# Patient Record
Sex: Female | Born: 1975
Health system: Southern US, Community
[De-identification: ages and names within clinical notes are randomized; demographics above are authoritative.]

## PROBLEM LIST (undated history)

## (undated) DIAGNOSIS — N12 Tubulo-interstitial nephritis, not specified as acute or chronic: Secondary | ICD-10-CM

## (undated) DIAGNOSIS — I10 Essential (primary) hypertension: Secondary | ICD-10-CM

## (undated) DIAGNOSIS — N189 Chronic kidney disease, unspecified: Secondary | ICD-10-CM

## (undated) HISTORY — PX: TUBAL LIGATION: SHX77

---

## 1997-12-03 ENCOUNTER — Emergency Department (HOSPITAL_COMMUNITY): Admission: EM | Admit: 1997-12-03 | Discharge: 1997-12-04 | Payer: Self-pay

## 1998-11-24 ENCOUNTER — Other Ambulatory Visit: Admission: RE | Admit: 1998-11-24 | Discharge: 1998-11-24 | Payer: Self-pay | Admitting: *Deleted

## 1999-02-09 ENCOUNTER — Other Ambulatory Visit: Admission: RE | Admit: 1999-02-09 | Discharge: 1999-02-09 | Payer: Self-pay | Admitting: *Deleted

## 1999-03-04 ENCOUNTER — Other Ambulatory Visit: Admission: RE | Admit: 1999-03-04 | Discharge: 1999-03-04 | Payer: Self-pay | Admitting: *Deleted

## 1999-03-04 ENCOUNTER — Encounter (INDEPENDENT_AMBULATORY_CARE_PROVIDER_SITE_OTHER): Payer: Self-pay

## 1999-07-26 ENCOUNTER — Other Ambulatory Visit: Admission: RE | Admit: 1999-07-26 | Discharge: 1999-07-26 | Payer: Self-pay | Admitting: *Deleted

## 2000-01-31 ENCOUNTER — Other Ambulatory Visit: Admission: RE | Admit: 2000-01-31 | Discharge: 2000-01-31 | Payer: Self-pay | Admitting: *Deleted

## 2000-02-14 ENCOUNTER — Other Ambulatory Visit: Admission: RE | Admit: 2000-02-14 | Discharge: 2000-02-14 | Payer: Self-pay | Admitting: *Deleted

## 2000-08-01 ENCOUNTER — Other Ambulatory Visit: Admission: RE | Admit: 2000-08-01 | Discharge: 2000-08-01 | Payer: Self-pay | Admitting: Obstetrics and Gynecology

## 2000-12-06 ENCOUNTER — Inpatient Hospital Stay (HOSPITAL_COMMUNITY): Admission: AD | Admit: 2000-12-06 | Discharge: 2000-12-23 | Payer: Self-pay | Admitting: Obstetrics and Gynecology

## 2000-12-06 ENCOUNTER — Encounter (INDEPENDENT_AMBULATORY_CARE_PROVIDER_SITE_OTHER): Payer: Self-pay

## 2000-12-07 ENCOUNTER — Encounter: Payer: Self-pay | Admitting: Obstetrics and Gynecology

## 2000-12-09 ENCOUNTER — Encounter: Payer: Self-pay | Admitting: Obstetrics and Gynecology

## 2000-12-11 ENCOUNTER — Encounter: Payer: Self-pay | Admitting: Obstetrics and Gynecology

## 2000-12-14 ENCOUNTER — Encounter: Payer: Self-pay | Admitting: Obstetrics and Gynecology

## 2000-12-19 ENCOUNTER — Encounter: Payer: Self-pay | Admitting: Obstetrics and Gynecology

## 2000-12-20 ENCOUNTER — Encounter: Payer: Self-pay | Admitting: Obstetrics and Gynecology

## 2000-12-24 ENCOUNTER — Encounter: Admission: RE | Admit: 2000-12-24 | Discharge: 2001-01-23 | Payer: Self-pay | Admitting: Obstetrics and Gynecology

## 2001-01-24 ENCOUNTER — Encounter: Admission: RE | Admit: 2001-01-24 | Discharge: 2001-02-23 | Payer: Self-pay | Admitting: Obstetrics and Gynecology

## 2001-03-26 ENCOUNTER — Encounter: Admission: RE | Admit: 2001-03-26 | Discharge: 2001-04-25 | Payer: Self-pay | Admitting: Obstetrics and Gynecology

## 2001-05-26 ENCOUNTER — Encounter: Admission: RE | Admit: 2001-05-26 | Discharge: 2001-06-25 | Payer: Self-pay | Admitting: Obstetrics and Gynecology

## 2001-06-26 ENCOUNTER — Encounter: Admission: RE | Admit: 2001-06-26 | Discharge: 2001-07-26 | Payer: Self-pay | Admitting: Obstetrics and Gynecology

## 2001-10-29 ENCOUNTER — Ambulatory Visit (HOSPITAL_COMMUNITY): Admission: RE | Admit: 2001-10-29 | Discharge: 2001-10-29 | Payer: Self-pay | Admitting: Urology

## 2001-10-29 ENCOUNTER — Encounter: Payer: Self-pay | Admitting: Urology

## 2001-11-26 ENCOUNTER — Encounter: Payer: Self-pay | Admitting: Urology

## 2001-11-26 ENCOUNTER — Ambulatory Visit (HOSPITAL_COMMUNITY): Admission: RE | Admit: 2001-11-26 | Discharge: 2001-11-26 | Payer: Self-pay | Admitting: Urology

## 2002-01-28 ENCOUNTER — Other Ambulatory Visit: Admission: RE | Admit: 2002-01-28 | Discharge: 2002-01-28 | Payer: Self-pay | Admitting: *Deleted

## 2004-01-30 ENCOUNTER — Emergency Department (HOSPITAL_COMMUNITY): Admission: EM | Admit: 2004-01-30 | Discharge: 2004-01-30 | Payer: Self-pay | Admitting: Emergency Medicine

## 2005-03-31 ENCOUNTER — Other Ambulatory Visit: Admission: RE | Admit: 2005-03-31 | Discharge: 2005-03-31 | Payer: Self-pay | Admitting: *Deleted

## 2005-08-04 ENCOUNTER — Other Ambulatory Visit: Admission: RE | Admit: 2005-08-04 | Discharge: 2005-08-04 | Payer: Self-pay | Admitting: *Deleted

## 2005-12-15 ENCOUNTER — Other Ambulatory Visit: Admission: RE | Admit: 2005-12-15 | Discharge: 2005-12-15 | Payer: Self-pay | Admitting: *Deleted

## 2006-04-08 ENCOUNTER — Emergency Department (HOSPITAL_COMMUNITY): Admission: EM | Admit: 2006-04-08 | Discharge: 2006-04-08 | Payer: Self-pay | Admitting: Family Medicine

## 2006-09-11 ENCOUNTER — Other Ambulatory Visit: Admission: RE | Admit: 2006-09-11 | Discharge: 2006-09-11 | Payer: Self-pay | Admitting: *Deleted

## 2007-12-03 ENCOUNTER — Other Ambulatory Visit: Admission: RE | Admit: 2007-12-03 | Discharge: 2007-12-03 | Payer: Self-pay | Admitting: Gynecology

## 2009-07-29 ENCOUNTER — Emergency Department (HOSPITAL_COMMUNITY): Admission: EM | Admit: 2009-07-29 | Discharge: 2009-07-29 | Payer: Self-pay | Admitting: Family Medicine

## 2010-03-19 ENCOUNTER — Ambulatory Visit (HOSPITAL_BASED_OUTPATIENT_CLINIC_OR_DEPARTMENT_OTHER): Payer: 59 | Admitting: Oncology

## 2010-08-18 ENCOUNTER — Encounter: Payer: 59 | Admitting: Oncology

## 2010-08-18 DIAGNOSIS — D509 Iron deficiency anemia, unspecified: Secondary | ICD-10-CM

## 2010-08-18 LAB — CBC & DIFF AND RETIC
BASO%: 0.3 % (ref 0.0–2.0)
Basophils Absolute: 0 10*3/uL (ref 0.0–0.1)
EOS%: 5.4 % (ref 0.0–7.0)
Eosinophils Absolute: 0.3 10*3/uL (ref 0.0–0.5)
HCT: 34.7 % — ABNORMAL LOW (ref 34.8–46.6)
HGB: 10.7 g/dL — ABNORMAL LOW (ref 11.6–15.9)
Immature Retic Fract: 5.4 % (ref 0.00–10.70)
LYMPH%: 31.5 % (ref 14.0–49.7)
MCH: 23.3 pg — ABNORMAL LOW (ref 25.1–34.0)
MCHC: 30.8 g/dL — ABNORMAL LOW (ref 31.5–36.0)
MCV: 75.4 fL — ABNORMAL LOW (ref 79.5–101.0)
MONO#: 0.3 10*3/uL (ref 0.1–0.9)
MONO%: 5.1 % (ref 0.0–14.0)
NEUT#: 3.5 10*3/uL (ref 1.5–6.5)
NEUT%: 57.7 % (ref 38.4–76.8)
Platelets: 395 10*3/uL (ref 145–400)
RBC: 4.6 10*6/uL (ref 3.70–5.45)
RDW: 16.6 % — ABNORMAL HIGH (ref 11.2–14.5)
Retic %: 0.59 % (ref 0.50–1.50)
Retic Ct Abs: 27.14 10*3/uL (ref 18.30–72.70)
WBC: 6.1 10*3/uL (ref 3.9–10.3)
lymph#: 1.9 10*3/uL (ref 0.9–3.3)

## 2010-08-18 LAB — COMPREHENSIVE METABOLIC PANEL
ALT: 12 U/L (ref 0–35)
Albumin: 3.8 g/dL (ref 3.5–5.2)
CO2: 29 mEq/L (ref 19–32)
Calcium: 9.4 mg/dL (ref 8.4–10.5)
Chloride: 98 mEq/L (ref 96–112)
Glucose, Bld: 154 mg/dL — ABNORMAL HIGH (ref 70–99)
Sodium: 137 mEq/L (ref 135–145)
Total Bilirubin: 0.5 mg/dL (ref 0.3–1.2)
Total Protein: 8.3 g/dL (ref 6.0–8.3)

## 2010-08-18 LAB — FERRITIN: Ferritin: 63 ng/mL (ref 10–291)

## 2010-08-18 LAB — CHCC SMEAR

## 2010-08-18 LAB — MORPHOLOGY

## 2010-08-18 LAB — IRON AND TIBC
%SAT: 7 % — ABNORMAL LOW (ref 20–55)
Iron: 19 ug/dL — ABNORMAL LOW (ref 42–145)

## 2010-08-18 LAB — LACTATE DEHYDROGENASE: LDH: 109 U/L (ref 94–250)

## 2010-08-24 ENCOUNTER — Encounter (HOSPITAL_BASED_OUTPATIENT_CLINIC_OR_DEPARTMENT_OTHER): Payer: 59 | Admitting: Oncology

## 2010-08-24 DIAGNOSIS — D509 Iron deficiency anemia, unspecified: Secondary | ICD-10-CM

## 2010-10-01 NOTE — Discharge Summary (Signed)
[facility]  Patient:    Sandra Carpenter, Sandra Carpenter                        MRN: [id number] Adm. Date:  [id number] Disc. Date: 12/23/00 Attending:  Leonard Schwartz Dictator:   Mack Guise, C.N.M.                           Discharge Summary  ADMISSION DIAGNOSES:          1. Intrauterine pregnancy at 29-2/7 weeks.                               2. Preeclampsia.                               3. Pulmonary edema.                               4. Urinary tract infection.                               5. Anemia.                               6. Positive JKa antibody.                               7. Positive group B strep.                               8. Nonreassuring fetal heart rate tracing.  PROCEDURES:                   Primary low transverse cesarean delivery at 31-1/7 weeks.  DISCHARGE DIAGNOSES:          1. Intrauterine pregnancy at 29-2/7 weeks.                               2. Preeclampsia.                               3. Pulmonary edema.                               4. Urinary tract infection.                               5. Anemia.                               6. Positive JKa antibody.                               7. Positive group B strep.  8. Nonreassuring fetal heart rate tracing.                               9. Primary low transverse cesarean delivery at                                  31-1/7 weeks.  HOSPITAL COURSE:              Ms. Sandra Carpenter is a 35 year old, gravida 3, para 1-0-1-1, who was admitted at 29-2/7 weeks for PIH and preeclampsia.  She received tocolysis with magnesium sulfate and betamethasone series for fetal lung maturity.  Her UTI was treated with IV antibiotics.  Her PIH laboratories remained stable, and her blood pressure remained under control.  She developed pulmonary edema, which was treated with diuretics.  On the day of December 20, 2000, the patients blood pressure became more labile,  and fetal heart rate tracing became nonreassuring.  Ultrasound found abnormal Doppler studies, and the decision was made to undergo cesarean delivery at that time.  The patient underwent primary low transverse cesarean section on December 20, 2000, by Dr. Marline Backbone, with the birth of a 3-pound 5-ounce female infant named Madelin Rear, with Apgars of 4 at one minute, 9 at five minutes.  The patient has been stable in the NICU, and mother has been stable in the immediate postoperative period.  She continued on magnesium sulfate for over 24 hours. Her postoperative laboratory work is all within normal limits.  Hemoglobin on the first postoperative day was 8.2, WBC 9.9, platelets 179,000.  Potassium 5.1, BUN 19, creatinine 1.5, SGOT 27, SGPT 8.  Her blood pressure has been stable in the range of 140-160/90-100.  She is currently taking labetalol 100 mg p.o. b.i.d.  Her urine output has been adequate, and she has been deemed to be in satisfactory condition for discharge.  DISCHARGE INSTRUCTIONS:       Per [facility] handout.  DISCHARGE MEDICATIONS:        1. Motrin 600 mg p.o. q.6h. p.r.n. pain.                               2. Tylox 1-2 p.o. q.3-4h. pain.                               3. Labetalol 100 mg p.o. b.i.d.  FOLLOW-UP:                    The patient will call the office of [facility] to arrange an appointment for blood pressure check this week.  She will also be followed at six weeks postoperatively. DD:  12/23/00 TD:  12/24/00 Job: [id number]

## 2010-10-01 NOTE — H&P (Signed)
[facility]  Patient:    Sandra Carpenter, Sandra Carpenter                          MRN: [id number] Adm. Date:  12/06/00 Attending:  Janine Limbo, M.D. Dictator:   Wynelle Bourgeois, CNM                         History and Physical  BRIEF HISTORY:  Ms. Sandra Carpenter is a 35 year old G3, para 1, 0-1-1 at 10 and 2/7th weeks who presents from the office with hypertension, hyperreflexia, proteinuria and edema.  She denies headache or scotoma.  Her pregnancy has been followed by the M.D. service and is remarkable for a history of:  1. Abnormal Pap smear with [facility] biopsy. 2. History of asthma. 3. Group B streptococcus positive. 4. Anti JKA antibody. 5. Equivocal rubella. 6. Anemia.  OB HISTORY: 1. Remarkable for an EAB in 1991, at 11 to [redacted] weeks gestation. 2. Vaginal delivery in August of 1997 of a female infant at [redacted] weeks gestation    weighing 6 pounds 7 ounces complicated by a nuchal cord.  MEDICAL HISTORY:  Remarkable for a history of anemia.  History of abnormal Pap smear with a [facility] biopsy in 2001, childhood varicella.  History of hyperemesis in her first pregnancy.  History of asthma.  Frequent UTIs.  A history of pyelonephritis.  FAMILY HISTORY:  Remarkable for maternal grandmother and uncle with diabetes. Maternal uncle with seizures, paternal uncle with lupus, maternal aunt x 2 with breast cancer.  GENETIC HISTORY:  Unremarkable.  SOCIAL HISTORY:  Patient is single but involved with Graciella Freer who is involved and supportive.  She is of the [place] faith.  She denies any alcohol, tobacco or drug use.  PHYSICAL EXAMINATION:  VITAL SIGNS:  Blood pressure 98.7, blood pressure 171/104.  HEENT:  Within normal limits except for 1 plus edema in the face.  NECK:  Thyroid normal.  Not enlarged.  BREASTS:  Soft, nontender, no masses.  HEART:  Rate is regular rate and rhythm.  LUNGS:  Clear to auscultation bilaterally.  ABDOMEN:  Gravid at 29 cm.  EFM shows a  reactive fetal heart rate with positive accelerations and no uterine contractions.  CERVICAL EXAMINATION:  Closed 40% effaced, soft, minus 4 station, and questionable presenting part.  EXTREMITIES:  1 to 2+ edema throughout.  DTRs are 3+ with 2 beats of clonus.  LABORATORY DATA:  Her labs today show a catheterized urine sample of 100 mg per deciliter of protein.  CBC is remarkable for a hemoglobin of 8.6 and a hematocrit of 26.2, and a platelet count of 272,000.  Uric acid is 6.5.  LDH is 155.  Alk. phos. is 228.  SGOT 20, SGPT 14, other chemistries within normal limits.  ASSESSMENT: 1. Intrauterine pregnancy at 66 and 2/7 weeks. 2. Preeclampsia. 3. Anemia with anti JKA antibody. 4. Group B streptococcus positive.  PLAN: 1. Admit per Dr. Krista Blue. 2. Magnesium sulfate 4 grams IV now then 2 grams per hour. 3. Betamethasone 12.5 mg IM now and repeat in 24 hours. 4. A 24 hour urine for protein and creatinine clearance. 5. Further orders per Dr. Stefano Gaul.DD:  12/06/00 TD:  12/06/00 Job: [id number]

## 2010-10-01 NOTE — Op Note (Signed)
[facility]  Patient:    Sandra Carpenter, Sandra Carpenter                          MRN: [id number] Proc. Date: 12/20/00 Attending:  Janine Limbo, M.D.                           Operative Report  PREOPERATIVE DIAGNOSES:       1. A 37-1/[redacted] week gestation.                               2. Preeclampsia.                               3. Nonreassuring fetal heart rate tracing.  POSTOPERATIVE DIAGNOSES:      1. A 37-1/[redacted] week gestation.                               2. Preeclampsia.                               3. Nonreassuring fetal heart rate tracing.  PROCEDURE:                    Primary low transverse cesarean section.  SURGEON:                      Janine Limbo, M.D.  FIRST ASSISTANT:              Mack Guise, C.N.M.  ANESTHESIA:                   Spinal.  INDICATIONS:                  The patient is a 35 year old female, gravida 3, para 1-0-1-1 without was admitted on December 06, 2000 because of preeclampsia. The patient was started on magnesium.  She was given betamethasone.  She was observed in the hospital.  Her blood pressures began to rise sharply on December 19, 2000.  Her blood pressures today were as high as 167/118 in spite of p.o. and IV Apresoline as well as p.o. and IV Labetalol.  In addition, the patient began having late decelerations on the fetal monitor.  An ultrasound was performed and it showed an abnormal uterine artery as well as middle cerebral artery Doppler study.  I discussed the options for management with the patient, and the decision was made to proceed with cesarean delivery.  We reviewed the risks associated with cesarean section including but not limited to anesthetic complications, bleeding, infection and possible damage to the surrounding organs.  FINDINGS:                     A 3 lb 5 oz female infant Madelin Rear) was delivered. Apgars were 4 at one minute and 9 at five minutes.  The uterus, fallopian tubes and ovaries were  normal for the gravid state.  DESCRIPTION OF PROCEDURE:     The patient was taken to the operating room, where a spinal anesthetic was given.  The patients abdomen and perineum were prepped with multiple layers of Betadine.  A Foley catheter was placed  in the bladder.  The patient was sterilely draped.  A low transverse incision was made in the abdomen and carried sharply through the subcutaneous tissue, the fascia and the anterior peritoneum.  An incision was made in the lower uterine segment and extended transversely.  We were going to deliver the infant from a vertex presentation.  However, the vertex floated to the patients right and then in the right upper quadrant.  We therefore grabbed the feet of the infant and delivered the infant by double footling breech extraction.  Apgars were 4 at one minute and 9 at five minutes.  The cord was clamped and cut.  The infant was handed to the awaiting pediatric team.  Routine cord blood studies were obtained.  The placenta was removed.  The uterine cavity was cleaned of amniotic fluid, clotted blood and membranes.  The uterine incision was closed using a running locking suture of 2-0 Vicryl.  Hemostasis was adequate.  The pelvic cavity was vigorously irrigated.  All instruments were removed. Hemostasis was noted to be adequate.  The anterior peritoneum and the abdominal musculature were reapproximated in the midline using 2-0 Vicryl. The fascia and the subcutaneous layer were irrigated.  The fascia was closed using a running suture of 0 Vicryl followed by three interrupted sutures of 0 Vicryl.  The subcutaneous layer was closed using a running suture of 2-0 Vicryl.  The skin was reapproximated using skin staples. Sponge, needle and instrument counts were correct on two occasions.  Estimated blood loss was 500 cc.  The patient tolerated the procedure well.  The patient was taken to the recovery room in stable condition.  The infant was taken to  the intensive-care nursery for observation. DD:  12/20/00 TD:  12/21/00 Job: [id number]

## 2010-10-01 NOTE — Op Note (Signed)
NAMEMarland Carpenter  DARALYN, BERT NO.:  [id number]   MEDICAL RECORD NO.:  [id number]                   PATIENT TYPE:  EMS   LOCATION:  ED                                   [facility]ACILITY[facility]  [facility]   PHYSICIAN:  Abigail Miyamoto, M.D.              DATE OF BIRTH:  1976-05-12   DATE OF PROCEDURE:  01/30/2004  DATE OF DISCHARGE:                                 OPERATIVE REPORT   PREOPERATIVE DIAGNOSIS:  Suprapubic groin abscess.   POSTOPERATIVE DIAGNOSIS:  Suprapubic groin abscess.   PROCEDURE:  Incision and drainage of right groin abscess.   SURGEON:  Dr. Riley Lam A. Blackman   ANESTHESIA:  1% lidocaine.   PROCEDURE IN DETAIL:  The patient was on the stretcher in the emergency  department.  Her right groin was then prepped and draped the usual sterile  fashion.  The skin overlying the abscess was then anesthetized with 1%  lidocaine.  A small incision was then make with an 11 blade, and the abscess  cavity was entered, and a moderate amount of purulence was drained.  The  wound was the probed with a hemostat, and no further purulent pockets were  identified.  The wound was then packed with a dry gauze.  More gauze was  then placed over this and this taped in place.  The patient tolerated the  procedure well.  The plan at this point will be to discharge her home on  antibiotics with wound care instructions given and follow up in my office in  4 days.      DB/MEDQ  D:  01/30/2004  T:  02/01/2004  Job:  [id number]

## 2012-10-25 ENCOUNTER — Encounter (HOSPITAL_COMMUNITY): Payer: Self-pay | Admitting: Emergency Medicine

## 2012-10-25 ENCOUNTER — Emergency Department (HOSPITAL_COMMUNITY)
Admission: EM | Admit: 2012-10-25 | Discharge: 2012-10-25 | Disposition: A | Discharge status: Home / Self Care | Payer: 59 | Attending: Emergency Medicine | Admitting: Emergency Medicine

## 2012-10-25 DIAGNOSIS — T2111XA Burn of first degree of chest wall, initial encounter: Secondary | ICD-10-CM

## 2012-10-25 DIAGNOSIS — T2210XA Burn of first degree of shoulder and upper limb, except wrist and hand, unspecified site, initial encounter: Secondary | ICD-10-CM

## 2012-10-25 DIAGNOSIS — Y9289 Other specified places as the place of occurrence of the external cause: Secondary | ICD-10-CM | POA: Insufficient documentation

## 2012-10-25 DIAGNOSIS — Z880 Allergy status to penicillin: Secondary | ICD-10-CM | POA: Insufficient documentation

## 2012-10-25 DIAGNOSIS — T2600XA Burn of unspecified eyelid and periocular area, initial encounter: Secondary | ICD-10-CM

## 2012-10-25 DIAGNOSIS — Z79899 Other long term (current) drug therapy: Secondary | ICD-10-CM | POA: Insufficient documentation

## 2012-10-25 DIAGNOSIS — Z23 Encounter for immunization: Secondary | ICD-10-CM | POA: Insufficient documentation

## 2012-10-25 DIAGNOSIS — X028XXA Other exposure to controlled fire in building or structure, initial encounter: Secondary | ICD-10-CM | POA: Insufficient documentation

## 2012-10-25 DIAGNOSIS — I10 Essential (primary) hypertension: Secondary | ICD-10-CM | POA: Insufficient documentation

## 2012-10-25 DIAGNOSIS — Y9389 Activity, other specified: Secondary | ICD-10-CM | POA: Insufficient documentation

## 2012-10-25 HISTORY — DX: Essential (primary) hypertension: I10

## 2012-10-25 LAB — COMPREHENSIVE METABOLIC PANEL
AST: 20 U/L (ref 0–37)
CO2: 28 mEq/L (ref 19–32)
Calcium: 9.7 mg/dL (ref 8.4–10.5)
Creatinine, Ser: 1.06 mg/dL (ref 0.50–1.10)
GFR calc non Af Amer: 65 mL/min — ABNORMAL LOW (ref >90)

## 2012-10-25 LAB — CBC WITH DIFFERENTIAL/PLATELET
Basophils Absolute: 0 10*3/uL (ref 0.0–0.1)
Basophils Relative: 0 % (ref 0–1)
Eosinophils Absolute: 0.3 10*3/uL (ref 0.0–0.7)
Eosinophils Relative: 3 % (ref 0–5)
HCT: 33.5 % — ABNORMAL LOW (ref 36.0–46.0)
MCH: 23.8 pg — ABNORMAL LOW (ref 26.0–34.0)
MCHC: 31.9 g/dL (ref 30.0–36.0)
MCV: 74.6 fL — ABNORMAL LOW (ref 78.0–100.0)
Monocytes Absolute: 0.6 10*3/uL (ref 0.1–1.0)
RDW: 16.7 % — ABNORMAL HIGH (ref 11.5–15.5)

## 2012-10-25 MED ORDER — TETANUS-DIPHTH-ACELL PERTUSSIS 5-2.5-18.5 LF-MCG/0.5 IM SUSP
0.5000 mL | Freq: Once | INTRAMUSCULAR | Status: AC
  Administered 2012-10-25: 0.5 mL via INTRAMUSCULAR
  Filled 2012-10-25 (×2): qty 0.5

## 2012-10-25 MED ORDER — BACITRACIN-POLYMYXIN B 500-10000 UNIT/GM OP OINT
TOPICAL_OINTMENT | Freq: Four times a day (QID) | OPHTHALMIC | Status: DC
  Administered 2012-10-25: 23:00:00 via OPHTHALMIC
  Filled 2012-10-25: qty 3.5

## 2012-10-25 MED ORDER — HYDROCODONE-ACETAMINOPHEN 5-325 MG PO TABS: 1.0000 | ORAL_TABLET | ORAL | Status: DC | PRN

## 2012-10-25 MED ORDER — FENTANYL CITRATE 0.05 MG/ML IJ SOLN
50.0000 ug | Freq: Once | INTRAMUSCULAR | Status: AC
  Administered 2012-10-25: 50 ug via INTRAVENOUS
  Filled 2012-10-25: qty 2

## 2012-10-25 MED ORDER — BACITRACIN ZINC 500 UNIT/GM EX OINT
TOPICAL_OINTMENT | Freq: Two times a day (BID) | CUTANEOUS | Status: DC
  Administered 2012-10-25: 23:00:00 via TOPICAL
  Filled 2012-10-25: qty 15

## 2012-10-25 MED ORDER — BACITRACIN-POLYMYXIN B 500-10000 UNIT/GM OP OINT: TOPICAL_OINTMENT | Freq: Four times a day (QID) | OPHTHALMIC | Status: DC

## 2012-10-25 MED ORDER — BACITRACIN ZINC 500 UNIT/GM EX OINT: TOPICAL_OINTMENT | Freq: Two times a day (BID) | CUTANEOUS | Status: DC

## 2012-10-25 NOTE — ED Notes (Signed)
Dr. Ignacia Palma made aware of pt situation. sts does not need EKG at time. And agreeable with protocol orders placed.
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
## 2012-11-08 ENCOUNTER — Inpatient Hospital Stay (HOSPITAL_COMMUNITY)
Admission: AD | Admit: 2012-11-08 | Discharge: 2012-11-11 | DRG: 690 | Disposition: A | Discharge status: Home / Self Care | Payer: Managed Care, Other (non HMO) | Source: Ambulatory Visit | Attending: Urology | Admitting: Urology

## 2012-11-08 ENCOUNTER — Encounter (HOSPITAL_COMMUNITY): Payer: Self-pay | Admitting: *Deleted

## 2012-11-08 DIAGNOSIS — R112 Nausea with vomiting, unspecified: Secondary | ICD-10-CM | POA: Diagnosis present

## 2012-11-08 DIAGNOSIS — N10 Acute tubulo-interstitial nephritis: Principal | ICD-10-CM | POA: Diagnosis present

## 2012-11-08 DIAGNOSIS — R42 Dizziness and giddiness: Secondary | ICD-10-CM | POA: Diagnosis present

## 2012-11-08 DIAGNOSIS — I10 Essential (primary) hypertension: Secondary | ICD-10-CM | POA: Diagnosis present

## 2012-11-08 DIAGNOSIS — N151 Renal and perinephric abscess: Secondary | ICD-10-CM | POA: Diagnosis present

## 2012-11-08 HISTORY — DX: Chronic kidney disease, unspecified: N18.9

## 2012-11-08 LAB — CBC WITH DIFFERENTIAL/PLATELET
Basophils Absolute: 0 10*3/uL (ref 0.0–0.1)
Eosinophils Absolute: 0 10*3/uL (ref 0.0–0.7)
Lymphs Abs: 0.7 10*3/uL (ref 0.7–4.0)
MCH: 23.8 pg — ABNORMAL LOW (ref 26.0–34.0)
MCHC: 32.4 g/dL (ref 30.0–36.0)
MCV: 73.5 fL — ABNORMAL LOW (ref 78.0–100.0)
Monocytes Absolute: 1.1 10*3/uL — ABNORMAL HIGH (ref 0.1–1.0)
Platelets: 186 10*3/uL (ref 150–400)
RDW: 17.5 % — ABNORMAL HIGH (ref 11.5–15.5)
WBC Morphology: INCREASED
WBC: 18.5 10*3/uL — ABNORMAL HIGH (ref 4.0–10.5)

## 2012-11-08 LAB — URINALYSIS, ROUTINE W REFLEX MICROSCOPIC
Ketones, ur: NEGATIVE mg/dL
Nitrite: NEGATIVE
Specific Gravity, Urine: 1.027 (ref 1.005–1.030)
pH: 5.5 (ref 5.0–8.0)

## 2012-11-08 LAB — COMPREHENSIVE METABOLIC PANEL
AST: 19 U/L (ref 0–37)
Albumin: 2.9 g/dL — ABNORMAL LOW (ref 3.5–5.2)
Alkaline Phosphatase: 72 U/L (ref 39–117)
BUN: 22 mg/dL (ref 6–23)
Chloride: 96 mEq/L (ref 96–112)
Potassium: 3.5 mEq/L (ref 3.5–5.1)
Total Bilirubin: 0.6 mg/dL (ref 0.3–1.2)

## 2012-11-08 LAB — URINE MICROSCOPIC-ADD ON

## 2012-11-08 MED ORDER — KCL IN DEXTROSE-NACL 20-5-0.45 MEQ/L-%-% IV SOLN
INTRAVENOUS | Status: DC
  Administered 2012-11-08 – 2012-11-11 (×7): via INTRAVENOUS
  Filled 2012-11-08 (×9): qty 1000

## 2012-11-08 MED ORDER — GENTAMICIN SULFATE 40 MG/ML IJ SOLN
460.0000 mg | Freq: Once | INTRAVENOUS | Status: AC
  Administered 2012-11-08: 460 mg via INTRAVENOUS
  Filled 2012-11-08: qty 11.5

## 2012-11-08 MED ORDER — ZOLPIDEM TARTRATE 5 MG PO TABS: 5.0000 mg | ORAL_TABLET | Freq: Every evening | ORAL | Status: DC | PRN

## 2012-11-08 MED ORDER — HYDROCODONE-ACETAMINOPHEN 5-325 MG PO TABS
1.0000 | ORAL_TABLET | ORAL | Status: DC | PRN
  Administered 2012-11-09: 2 via ORAL
  Administered 2012-11-09: 1 via ORAL
  Filled 2012-11-08: qty 1
  Filled 2012-11-08: qty 2

## 2012-11-08 MED ORDER — ONDANSETRON HCL 4 MG/2ML IJ SOLN
4.0000 mg | INTRAMUSCULAR | Status: DC | PRN
  Administered 2012-11-09 – 2012-11-11 (×9): 4 mg via INTRAVENOUS
  Filled 2012-11-08 (×5): qty 2
  Filled 2012-11-08: qty 4
  Filled 2012-11-08 (×3): qty 2

## 2012-11-08 MED ORDER — ACETAMINOPHEN 325 MG PO TABS
650.0000 mg | ORAL_TABLET | ORAL | Status: DC | PRN
  Administered 2012-11-08 – 2012-11-09 (×2): 650 mg via ORAL
  Filled 2012-11-08 (×2): qty 2

## 2012-11-08 MED ORDER — HYDROMORPHONE HCL PF 1 MG/ML IJ SOLN
0.5000 mg | INTRAMUSCULAR | Status: DC | PRN
  Administered 2012-11-08: 0.5 mg via INTRAVENOUS
  Administered 2012-11-09 – 2012-11-11 (×3): 1 mg via INTRAVENOUS
  Filled 2012-11-08 (×4): qty 1

## 2012-11-08 MED ORDER — VANCOMYCIN HCL IN DEXTROSE 750-5 MG/150ML-% IV SOLN
750.0000 mg | INTRAVENOUS | Status: DC
  Administered 2012-11-09 – 2012-11-10 (×2): 750 mg via INTRAVENOUS
  Filled 2012-11-08 (×2): qty 150

## 2012-11-08 MED ORDER — VANCOMYCIN HCL IN DEXTROSE 1-5 GM/200ML-% IV SOLN
1000.0000 mg | Freq: Once | INTRAVENOUS | Status: AC
  Administered 2012-11-08: 1000 mg via INTRAVENOUS
  Filled 2012-11-08: qty 200

## 2012-11-08 NOTE — Progress Notes (Addendum)
ANTIBIOTIC CONSULT NOTE - FOLLOW UP  Pharmacy Consult for:  Vancomycin, Gentamicin Indication:  Pyelonephritis  Allergies  Allergen Reactions  . Penicillins     Mouth breaks out    Patient Measurements: Height: 5\' 6"  (167.6 cm) Weight: 166 lb (75.297 kg) IBW/kg (Calculated) : 59.3  Vital Signs: Temp: 100.4 F (38 C) (06/26 1715) Temp src: Oral (06/26 1715) BP: 124/64 mmHg (06/26 1715) Pulse Rate: 117 (06/26 1715)  Labs:  WBCs 18.5  BUN 22  SCr 2.11  CrCl 37.1 ml/min Estimated Creatinine Clearance: 37.1 ml/min (by C-G formula).   Past Medical History: Hypertension  Assessment:  Asked to assist with therapy with Vancomycin and Gentamicin for this 38 year-old female with pyelonephritis.  Vancomycin was selected due to allergy to penicillin.  Known to have an atrophic right kidney  Serum creatinine has increased from 1.06 on 10/25/12 to 2.11 today.  Goals of Therapy:   Vancomycin trough levels 10-15 mcg/ml  Antibiotic doses appropriate for renal function  Eradication of infection  Plan:   Vancomycin 1000 mg as the initial dose, then 750 mg IV every 24 hours.  Gentamicin 460 mg IV x 1 dose (7 mg/kg with an adjusted weight of 65.4 kg).  Gentamicin trough level drawn 10 hours after the end of the  first dose infusion, to assist selection of the proper dosing interval.  Polo Riley R.Ph. 11/08/2012,5:45 PM

## 2012-11-08 NOTE — H&P (Signed)
History of Present Illness     Sandra Carpenter was seen at [facility] yesterday for moderate bilateral flank pain left greater than right.  The pain started two days ago and is now worse.  She was given Rocephin and discharged home on Bactrim DS and follow-up at our office today.  The pain has persisted and is worse today and associated with nausea and vomiting.  She has a past history of vesico ureteral reflux and had a STING ( Deflux) procedure in May 2008 at [facility] by Dr Yetta Flock.  She is known to have an atrophic right kidney.  Urinalysis today shows 21-50 WBC's, 7-10 RBC's, many bacteria. She is weak and has a temperature of 100.4. Renal ultrasound reveals an edematous left kidney and a small right kidney.  there is no hydronephrosis or renal abscess.   Past Medical History Problems  1. History of  Hypertension 401.9  Surgical History Problems  1. History of  Cesarean Section  Current Meds 1. AmLODIPine Besylate 5 MG Oral Tablet; Therapy: (Recorded:26Jun2014) to 2. Bactrim DS TABS; Therapy: (Recorded:26Jun2014) to 3. Hydrochlorothiazide 25 MG Oral Tablet; Therapy: (Recorded:26Jun2014) to 4. Ultram 50 MG Oral Tablet; Therapy: (Recorded:26Jun2014) to 5. Zofran 4 MG Oral Tablet; Therapy: (Recorded:26Jun2014) to  Allergies Medication  1. Penicillins  Family History Problems  1. Family history of  Blood In Urine 2. Family history of  Family Health Status - Father's Age 38. Family history of  Family Health Status - Mother's Age 80. Family history of  Family Health Status Number Of Children 5. Maternal history of  Heart Disease V17.49 6. Maternal history of  Nephrolithiasis  Social History Problems    Alcohol Use   Marital History - Currently Married   Never A Smoker   Occupation: Denied    History of  Caffeine Use  Review of Systems Genitourinary, constitutional, skin, eye, otolaryngeal, hematologic/lymphatic, cardiovascular, pulmonary, endocrine,  musculoskeletal, gastrointestinal, neurological and psychiatric system(s) were reviewed and pertinent findings if present are noted.  Genitourinary: urinary frequency, urinary urgency and nocturia.  Gastrointestinal: nausea and vomiting.  Constitutional: fever, night sweats and feeling tired (fatigue).    Vitals Vital Signs [Data Includes: Last 1 Day]  26Jun2014 03:05PM  BMI Calculated: 27.32 BSA Calculated: 1.87 Height: 5 ft 6 in Weight: 170 lb  Blood Pressure: 128 / 84 Temperature: 100.4 F Heart Rate: 121 Respiration: 18  Physical Exam Constitutional: Well nourished and well developed . No acute distress.  ENT:. The ears and nose are normal in appearance.  Neck: The appearance of the neck is normal and no neck mass is present.  Pulmonary: No respiratory distress and normal respiratory rhythm and effort.  Cardiovascular: Heart rate and rhythm are normal . No peripheral edema.  Abdomen: The abdomen is soft and nontender. No masses are palpated. Moderate tenderness in the RUQ is present and tenderness in the LUQ is present. moderate right CVA tenderness and severe left CVA tenderness no CVA tenderness. No hernias are palpable. No hepatosplenomegaly noted.  Genitourinary:  Chaperone Present: .  Examination of the external genitalia shows normal female external genitalia and no lesions. The urethra is normal in appearance and not tender. There is no urethral mass. Vaginal exam demonstrates no abnormalities. The cervix is is without abnormalities. The uterus is without abnormalities. The adnexa are palpably normal. The bladder is non tender and not distended. The anus is normal on inspection. The perineum is normal on inspection.  Lymphatics: The femoral and inguinal nodes are not enlarged  or tender.  Skin: Normal skin turgor, no visible rash and no visible skin lesions.  Neuro/Psych:. Mood and affect are appropriate.    Results/Data Urine [Data Includes: Last 1 Day]   26Jun2014  COLOR  YELLOW   APPEARANCE CLOUDY   SPECIFIC GRAVITY 1.020   pH 5.5   GLUCOSE NEG mg/dL  BILIRUBIN NEG   KETONE NEG mg/dL  BLOOD LARGE   PROTEIN 100 mg/dL  UROBILINOGEN 1 mg/dL  NITRITE NEG   LEUKOCYTE ESTERASE MOD   SQUAMOUS EPITHELIAL/HPF MANY   WBC 21-50 WBC/hpf  RBC 7-10 RBC/hpf  BACTERIA MANY   CRYSTALS NONE SEEN   CASTS NONE SEEN     RENAL ULTRASOUND INDICATION: Flank pain.  Pyelonephritis The right kidney measures 6.0 x 2.3 x 2.2 cm.  A 1.8 x 1.3 x 1.2 cm hypoechoic lesion is noted in the lower pole of the right kidney. The left kidney measures 13.3 x 5.9 x 6.1 cm.  The kidney is edematous.  There is a 1.3 x 0.9 x 1.4 cm hypoechoic lesion in the upper pole.   PVR is 29 ml. IMPRESSION: Bilateral renal cysts.  Left pyelonephritis.  No hydronephrosis.   Assessment Assessed  1. Pyelonephritis 590.80 2. Urinary Tract Infection 599.0  Plan Health Maintenance (V70.0)  1. UA With REFLEX  Done: 26Jun2014 02:38PM Pyelonephritis (590.80)  2. RENAL U/S COMPLETE  Done: 26Jun2014 12:00AM Urinary Tract Infection (599.0)  3. URINE CULTURE  Done: 26Jun2014   Will admit patient for IV antibiotics. See admission orders.

## 2012-11-09 ENCOUNTER — Inpatient Hospital Stay (HOSPITAL_COMMUNITY): Payer: Managed Care, Other (non HMO)

## 2012-11-09 LAB — GENTAMICIN LEVEL, RANDOM: Gentamicin Rm: 7.2 ug/mL

## 2012-11-09 LAB — CREATININE, SERUM: GFR calc non Af Amer: 21 mL/min — ABNORMAL LOW (ref >90)

## 2012-11-09 MED ORDER — IOHEXOL 300 MG/ML  SOLN
50.0000 mL | INTRAMUSCULAR | Status: AC
  Administered 2012-11-09: 50 mL via ORAL

## 2012-11-09 MED ORDER — LORAZEPAM 2 MG/ML IJ SOLN
0.2500 mg | Freq: Four times a day (QID) | INTRAMUSCULAR | Status: DC | PRN
  Administered 2012-11-10: 0.25 mg via INTRAVENOUS
  Filled 2012-11-09: qty 1

## 2012-11-09 NOTE — Progress Notes (Signed)
  Subjective: Patient reports : Feels weak.  Nausea and vomiting.  Objective: Vital signs in last 24 hours: Temp:  [99.1 F (37.3 C)-102.9 F (39.4 C)] 100.7 F (38.2 C) (06/27 0847) Pulse Rate:  [110-127] 110 (06/27 0542) Resp:  [16-18] 16 (06/27 0542) BP: (101-124)/(63-64) 101/63 mmHg (06/27 0542) SpO2:  [98 %-100 %] 100 % (06/27 0542) Weight:  [75.297 kg (166 lb)] 75.297 kg (166 lb) (06/26 1715)  Intake/Output from previous day: 06/26 0701 - 06/27 0700 In: 1614.6 [I.V.:1614.6] Out: 900 [Urine:900] Intake/Output this shift:    Physical Exam:  General: To max: 102.9. Left CVA tenderness Creat: rising: 2.11 yesterday  2.66 today. Was 1.06 on 6/and oriented and yes 12. Hgb: 9.0 Hct: 27.8  WBC: 18.5   Lab Results:  Recent Labs  11/08/12 1755  HGB 9.0*  HCT 27.8*   BMET  Recent Labs  11/08/12 1755 11/09/12 0755  NA 132*  --   K 3.5  --   CL 96  --   CO2 25  --   GLUCOSE 123*  --   BUN 22  --   CREATININE 2.11* 2.66*  CALCIUM 9.2  --     Recent Labs  11/08/12 1755  INR 1.32   No results found for this basename: LABURIN,  in the last 72 hours Results for orders placed in visit on 03/19/10  [facility] SMEAR     Status: None   Collection Time    08/18/10  3:11 PM      Result Value Range Status   Smear Result Smear Available   Final    Studies/Results: No results found.  Assessment/Plan: Pyelonephritis.  H/o left vesico ureteral reflux. Renal insufficiency Continue IV antibiotics. CT scan abdomen and pelvis without contrast to rule out renal or perirenal abscess.   LOS: 1 day   Sandra Carpenter 11/09/2012, 9:02 AM

## 2012-11-09 NOTE — Progress Notes (Signed)
ANTIBIOTIC CONSULT NOTE - FOLLOW UP  Pharmacy Consult for Gentamicin/Vancomycin Indication: pyelonephritis  Allergies  Allergen Reactions  . Penicillins     Mouth breaks out    Patient Measurements: Height: 5\' 6"  (167.6 cm) Weight: 166 lb (75.297 kg) IBW/kg (Calculated) : 59.3 Adjusted Body Weight: 65 kg  Vital Signs: Temp: 98.4 F (36.9 C) (06/27 1338) Temp src: Oral (06/27 1338) BP: 120/66 mmHg (06/27 1338) Pulse Rate: 86 (06/27 1338) Intake/Output from previous day: 06/26 0701 - 06/27 0700 In: 1614.6 [I.V.:1614.6] Out: 900 [Urine:900] Intake/Output from this shift: Total I/O In: 120 [P.O.:120] Out: -   Labs:  Recent Labs  11/08/12 1755 11/09/12 0755  WBC 18.5*  --   HGB 9.0*  --   PLT 186  --   CREATININE 2.11* 2.66*   Estimated Creatinine Clearance: 29.5 ml/min (by C-G formula based on Cr of 2.66).  Recent Labs  11/09/12 0755  GENTRANDOM 7.2     Microbiology: No results found for this or any previous visit (from the past 720 hour(s)).  Anti-infectives   Start     Dose/Rate Route Frequency Ordered Stop   11/09/12 1800  vancomycin (VANCOCIN) IVPB 750 mg/150 ml premix     750 mg 150 mL/hr over 60 Minutes Intravenous Every 24 hours 11/08/12 1929     11/08/12 2000  gentamicin (GARAMYCIN) 460 mg in dextrose 5 % 100 mL IVPB     460 mg 111.5 mL/hr over 60 Minutes Intravenous  Once 11/08/12 1926 11/08/12 2109   11/08/12 1830  vancomycin (VANCOCIN) IVPB 1000 mg/200 mL premix     1,000 mg 200 mL/hr over 60 Minutes Intravenous  Once 11/08/12 1812 11/08/12 1925      Assessment: 37 yoF presenting with bilateral flank pain starting 2 days ago.  PMH significant for left vesico ureteral reflux.  Patient was given Rocephin x 1 with prescription for Bactrim DS at urgent care.  Pain worsened and pt admitted for treatment of pyelonephritis.  Urology following and started vancomycin and gentamicin per pharmacy dosing 6/26.  Acute renal failure.  SCr continues to  worsen.  CrCl~33 ml/min (CG), ~32 ml/min/1.41m3 (normalized).  Tmax 100.4, WBC 18.5  Gentamicin 460 mg one-time dose given last night.  Random gentamicin level obtained 12 hours following dose = 7.2.   D#2 vancomycin 1g IV x 1 followed by 750 mg IV q24h.  Goal of Therapy:  Vancomycin trough level 10-15 mcg/ml Gentamicin trough level <2 mcg/ml  Plan:  1.  Holding further doses of gentamicin at this time.  Will resume traditional dosing gentamicin once trough level <1.  Will continue to follow and check random levels and re-dose as appropriate.  Urine culture pending.  Would consider a less nephrotoxic agent once culture results return. 2.  Continue Vancomycin 750 mg IV q24h. 3.  F/u SCr.  Clance Boll 11/09/2012,2:16 PM

## 2012-11-09 NOTE — Care Management Note (Signed)
    Page 1 of 1   11/09/2012     1:37:07 PM   CARE MANAGEMENT NOTE 11/09/2012  Patient:  Sandra Carpenter, Sandra Carpenter   Account Number:  [id number]  Date Initiated:  11/09/2012  Documentation initiated by:  Lanier Clam  Subjective/Objective Assessment:   ADMITTED W/BILATERAL FLANK PAIN.PYELONEPHRITIS.     Action/Plan:   FROM HOME.HAS PCP,& PHARMACY.   Anticipated DC Date:  11/13/2012   Anticipated DC Plan:  HOME/SELF CARE      DC Planning Services  CM consult      Choice offered to / List presented to:             Status of service:  In process, will continue to follow Medicare Important Message given?   (If response is "NO", the following Medicare IM given date fields will be blank) Date Medicare IM given:   Date Additional Medicare IM given:    Discharge Disposition:    Per UR Regulation:  Reviewed for med. necessity/level of care/duration of stay  If discussed at Long Length of Stay Meetings, dates discussed:    Comments:  11/09/12 [facility] RN,BSN NCM [phone]

## 2012-11-09 NOTE — Progress Notes (Signed)
Patient in distress upon entering room. Oxygen saturation 100% on room air. Audible wheezes in the upper lobes of lungs, decreased in lower. Patient very anxious and crying. Oxygen 2L nasal cannula applied to patient for comfort. VS stable at this time. MD notified. No new orders. Will continue to monitor patient. Setzer, Don Broach

## 2012-11-09 NOTE — Progress Notes (Signed)
1030 RRT called to room d/t respiratory distress. When arrived in room patient on 2 L [place] O2 saturation 100% and pulse 86 NSR. Pt able to follow commands and communicate with staff. Decreased breath sounds throughout. Dr. Brunilda Payor paged and examined pt at bedside. No new orders received.

## 2012-11-09 NOTE — Progress Notes (Signed)
Patient ID: Sandra Carpenter, female   DOB: 05/17/1975, 37 y.o.   MRN: [id number]  T: 99.4  BP: 120/66   P: 86   R: 16 Feels better this evening, but still feels weak.   No nausea or vomiting. CT scan: atrophic right kidney.  Left perinephric stranding with  1.4 cm low density in the upper pole of the left kidney? Small abscess. Plan: Continue IV antibiotics           Recheck BUN and Creatinine in AM

## 2012-11-09 NOTE — Progress Notes (Signed)
Patient had a temperature of 102.9 at 2300, VS otherwise stable. RN gave pt 650 mg of tylenol and cooling ice packs. Patient temperature after interventions returned to 99.1. Will continue to monitor patient.

## 2012-11-10 LAB — BASIC METABOLIC PANEL
BUN: 17 mg/dL (ref 6–23)
Calcium: 8.7 mg/dL (ref 8.4–10.5)
Creatinine, Ser: 2.27 mg/dL — ABNORMAL HIGH (ref 0.50–1.10)
GFR calc non Af Amer: 26 mL/min — ABNORMAL LOW (ref >90)
Glucose, Bld: 146 mg/dL — ABNORMAL HIGH (ref 70–99)

## 2012-11-10 LAB — CBC WITH DIFFERENTIAL/PLATELET
Eosinophils Absolute: 0 10*3/uL (ref 0.0–0.7)
Eosinophils Relative: 0 % (ref 0–5)
Lymphs Abs: 0.5 10*3/uL — ABNORMAL LOW (ref 0.7–4.0)
MCH: 23.5 pg — ABNORMAL LOW (ref 26.0–34.0)
MCV: 71.8 fL — ABNORMAL LOW (ref 78.0–100.0)
Monocytes Absolute: 1 10*3/uL (ref 0.1–1.0)
Monocytes Relative: 11 % (ref 3–12)
Platelets: 232 10*3/uL (ref 150–400)
RBC: 3.4 MIL/uL — ABNORMAL LOW (ref 3.87–5.11)

## 2012-11-10 LAB — URINE CULTURE

## 2012-11-10 MED ORDER — GENTAMICIN SULFATE 40 MG/ML IJ SOLN
460.0000 mg | INTRAVENOUS | Status: DC
  Administered 2012-11-10: 460 mg via INTRAVENOUS
  Filled 2012-11-10: qty 11.5

## 2012-11-10 NOTE — Progress Notes (Signed)
Subjective: Patient reports feeling dizzy and emesis  Objective: Vital signs in last 24 hours: Temp:  [98.4 F (36.9 C)-101.3 F (38.5 C)] 99.5 F (37.5 C) (06/28 0542) Pulse Rate:  [86-106] 101 (06/28 0542) Resp:  [16-18] 16 (06/28 0542) BP: (116-123)/(63-80) 116/63 mmHg (06/28 0542) SpO2:  [100 %] 100 % (06/28 0542)  Intake/Output from previous day: 06/27 0701 - 06/28 0700 In: 2270 [P.O.:120; I.V.:2000; IV Piggyback:150] Out: 600 [Urine:600] Intake/Output this shift: Total I/O In: 500 [I.V.:500] Out: 600 [Urine:600]  Physical Exam:  Constitutional: Vital signs reviewed. WD WN in NAD   Eyes: PERRL, No scleral icterus.   Pulmonary/Chest: Normal effort   Lab Results:  Recent Labs  11/08/12 1755 11/10/12 0534  HGB 9.0* 8.0*  HCT 27.8* 24.4*   BMET  Recent Labs  11/08/12 1755 11/09/12 0755  NA 132*  --   K 3.5  --   CL 96  --   CO2 25  --   GLUCOSE 123*  --   BUN 22  --   CREATININE 2.11* 2.66*  CALCIUM 9.2  --     Recent Labs  11/08/12 1755  INR 1.32   No results found for this basename: LABURIN,  in the last 72 hours Results for orders placed during the hospital encounter of 11/08/12  URINE CULTURE     Status: None   Collection Time    11/08/12  8:09 PM      Result Value Range Status   Specimen Description URINE, CLEAN CATCH   Final   Special Requests NONE   Final   Culture  Setup Time 11/09/2012 02:18   Final   Colony Count NO GROWTH   Final   Culture NO GROWTH   Final   Report Status 11/10/2012 FINAL   Final    Studies/Results: Ct Abdomen Pelvis Wo Contrast  11/09/2012   *RADIOLOGY REPORT*  Clinical Data: Left pyelonephritis.  Evaluate for abscess.  History of reflux.  CT ABDOMEN AND PELVIS WITHOUT CONTRAST  Technique:  Multidetector CT imaging of the abdomen and pelvis was performed following the standard protocol without intravenous contrast.  Comparison: None available.  Report from nuclear medicine renal imaging 11/26/2001 correlated.   Findings: There is dependent atelectasis at both lung bases.  Trace pleural fluid is present on the left.  There is no pericardial effusion.  There is marked chronic atrophy of the right kidney (previously reported).  The right kidney measures approximately 4.8 cm in length.  The left kidney is enlarged, measuring 13.3 cm in length. There is mild perinephric soft tissue stranding without focal perinephric fluid collection.  There is ill-defined low density in the upper pole of the left kidney, measuring 1.4 cm transverse on image 20.  There is a nonobstructing 2 mm calculus in the interpolar region of the left kidney on image 27.  The left renal pelvis and ureter are minimally dilated with mild wall thickening. No ureteral or bladder calculus is identified.  There are pelvic phleboliths bilaterally.  There is hepatic low density consistent with steatosis.  No focal liver lesions are identified.  The spleen, gallbladder, pancreas and adrenal glands appear unremarkable.  No bowel abnormalities or enlarged lymph nodes are identified.  The uterus is prominent without apparent focal abnormality.  There is no adnexal mass or adjacent inflammatory change.  There are no worrisome osseous findings.  IMPRESSION:  1.  Enlargement of the left kidney and perinephric soft tissue stranding suspicious for pyelonephritis.  (Some of the renal enlargement  could be secondary to compensatory hypertrophy.) There is ill-defined low density in the upper pole of the left kidney which could reflect a small abscess.  No perinephric fluid collection identified. 2.  Mild collecting system dilatation and wall thickening, likely inflammatory.  No ureteral calculus identified. 3.  Chronic atrophy of the right kidney. 4.  Hepatic steatosis. 5.  Generalized prominence of the uterus, suggesting possible fibroids or adenomyosis.   Original Report Authenticated By: Carey Bullocks, M.D.    Assessment/Plan:   Acute left pyelonephritis. She is  improving clinically, with regards to her creatinine. However, she is still having emesis and fever. Urine culture revealed no growth. She is on broad-spectrum antibiotics. We will continue the IV antibiotics, hydration, and follow creatinine.   LOS: 2 days   Sandra Carpenter M 11/10/2012, 6:08 AM

## 2012-11-10 NOTE — Progress Notes (Signed)
ANTIBIOTIC CONSULT NOTE - FOLLOW UP  Pharmacy Consult for Vancomycin, Gentamicin Indication: pyelonephritis  Allergies  Allergen Reactions  . Penicillins     Mouth breaks out    Patient Measurements: Height: 5\' 6"  (167.6 cm) Weight: 166 lb (75.297 kg) IBW/kg (Calculated) : 59.3 Adjusted Body Weight: 65kg  Vital Signs: Temp: 100.3 F (37.9 C) (06/28 1423) Temp src: Oral (06/28 1423) BP: 137/74 mmHg (06/28 1423) Pulse Rate: 112 (06/28 1423) Intake/Output from previous day: 06/27 0701 - 06/28 0700 In: 3207.5 [P.O.:120; I.V.:2937.5; IV Piggyback:150] Out: 600 [Urine:600]  Labs:  Recent Labs  11/08/12 1755 11/09/12 0755 11/10/12 0534  WBC 18.5*  --  9.8  HGB 9.0*  --  8.0*  PLT 186  --  232  CREATININE 2.11* 2.66* 2.27*   Estimated Creatinine Clearance: 34.5 ml/min (by C-G formula based on Cr of 2.27).  Recent Labs  11/09/12 2012 11/10/12 1410  GENTRANDOM 1.9 0.3    Microbiology: 6/26 Urine Culture: No growth (final)  Anti-infectives   Start     Dose/Rate Route Frequency Ordered Stop   11/09/12 1800  vancomycin (VANCOCIN) IVPB 750 mg/150 ml premix     750 mg 150 mL/hr over 60 Minutes Intravenous Every 24 hours 11/08/12 1929     11/08/12 2000  gentamicin (GARAMYCIN) 460 mg in dextrose 5 % 100 mL IVPB     460 mg 111.5 mL/hr over 60 Minutes Intravenous  Once 11/08/12 1926 11/08/12 2109   11/08/12 1830  vancomycin (VANCOCIN) IVPB 1000 mg/200 mL premix     1,000 mg 200 mL/hr over 60 Minutes Intravenous  Once 11/08/12 1812 11/08/12 1925      Assessment: 37 yoF presenting with bilateral flank pain starting 2 days PTA. PMH significant for left vesico ureteral reflux. Patient was given Rocephin x 1 with prescription for Bactrim DS at urgent care. Pain worsened and pt was admitted on 6/26 for treatment of pyelonephritis. Urology following and started vancomycin and gentamicin per pharmacy dosing 6/26.   Acute renal failure w/ chronic atrophy of the right kidney.   SCr mildly improved with CrCl ~ 35 ml/min (CG) and ~38 ml/min/1.75m3 (normalized).  Tmax 101.3, currently 99.5; WBC improved to 9.8  Urine culture w/o growth.  Day #3 Vancomycin and Gentamicin.  Initial gent level was elevated at 7.9 at 12 hours, decreased to 1.9 at 24 hours and level today returned as 0.3 about 43 hrs after first dose.  Goal of Therapy:  Vancomycin trough level 15-20 mcg/ml Gentamicin trough level <1 mcg/ml  Plan:   Based on levels, will start gentamicin 460mg  IV q48hr - start at 8pm tonight 6/28  Continue Vancomycin 750mg  IV q24h.  Follow up renal function.  Would recommend a less nephrotoxic agent once culture results return.   Hessie Knows, PharmD, BCPS Pager [phone] 11/10/2012 7:25 PM

## 2012-11-10 NOTE — Progress Notes (Signed)
ANTIBIOTIC CONSULT NOTE - FOLLOW UP  Pharmacy Consult for Gentamicin Indication: pyelonephritis   Allergies  Allergen Reactions  . Penicillins     Mouth breaks out    Patient Measurements: Height: 5\' 6"  (167.6 cm) Weight: 166 lb (75.297 kg) IBW/kg (Calculated) : 59.3 Adjusted Body Weight:   Vital Signs: Temp: 99.5 F (37.5 C) (06/28 0542) Temp src: Oral (06/28 0542) BP: 116/63 mmHg (06/28 0542) Pulse Rate: 101 (06/28 0542) Intake/Output from previous day: 06/27 0701 - 06/28 0700 In: 2270 [P.O.:120; I.V.:2000; IV Piggyback:150] Out: 600 [Urine:600] Intake/Output from this shift: Total I/O In: 500 [I.V.:500] Out: 600 [Urine:600]  Labs:  Recent Labs  11/08/12 1755 11/09/12 0755 11/10/12 0534  WBC 18.5*  --  9.8  HGB 9.0*  --  8.0*  PLT 186  --  232  CREATININE 2.11* 2.66* 2.27*   Estimated Creatinine Clearance: 34.5 ml/min (by C-G formula based on Cr of 2.27).  Recent Labs  11/09/12 0755 11/09/12 2012  GENTRANDOM 7.2 1.9     Microbiology: Recent Results (from the past 720 hour(s))  URINE CULTURE     Status: None   Collection Time    11/08/12  8:09 PM      Result Value Range Status   Specimen Description URINE, CLEAN CATCH   Final   Special Requests NONE   Final   Culture  Setup Time 11/09/2012 02:18   Final   Colony Count NO GROWTH   Final   Culture NO GROWTH   Final   Report Status 11/10/2012 FINAL   Final    Anti-infectives   Start     Dose/Rate Route Frequency Ordered Stop   11/09/12 1800  vancomycin (VANCOCIN) IVPB 750 mg/150 ml premix     750 mg 150 mL/hr over 60 Minutes Intravenous Every 24 hours 11/08/12 1929     11/08/12 2000  gentamicin (GARAMYCIN) 460 mg in dextrose 5 % 100 mL IVPB     460 mg 111.5 mL/hr over 60 Minutes Intravenous  Once 11/08/12 1926 11/08/12 2109   11/08/12 1830  vancomycin (VANCOCIN) IVPB 1000 mg/200 mL premix     1,000 mg 200 mL/hr over 60 Minutes Intravenous  Once 11/08/12 1812 11/08/12 1925       Assessment: Patient with gentamicin level still above desired level.    Goal of Therapy:  Gentamicin trough level <2 mcg/ml  Plan:  Measure antibiotic drug levels at steady state Follow up culture results Will recheck level at 1400, will redose the level <1  Darlina Guys, [place] Crowford 11/10/2012,6:23 AM

## 2012-11-10 NOTE — Progress Notes (Signed)
Patient continues to complain of nausea and has had one episode of vomiting. Gave prn zofran (see MAR). Will continue to monitor.

## 2012-11-10 NOTE — Progress Notes (Signed)
ANTIBIOTIC CONSULT NOTE - FOLLOW UP  Pharmacy Consult for Vancomycin, Gentamicin Indication: pyelonephritis  Allergies  Allergen Reactions  . Penicillins     Mouth breaks out    Patient Measurements: Height: 5\' 6"  (167.6 cm) Weight: 166 lb (75.297 kg) IBW/kg (Calculated) : 59.3 Adjusted Body Weight: 65kg  Vital Signs: Temp: 99.5 F (37.5 C) (06/28 0542) Temp src: Oral (06/28 0542) BP: 116/63 mmHg (06/28 0542) Pulse Rate: 101 (06/28 0542) Intake/Output from previous day: 06/27 0701 - 06/28 0700 In: 3207.5 [P.O.:120; I.V.:2937.5; IV Piggyback:150] Out: 600 [Urine:600]  Labs:  Recent Labs  11/08/12 1755 11/09/12 0755 11/10/12 0534  WBC 18.5*  --  9.8  HGB 9.0*  --  8.0*  PLT 186  --  232  CREATININE 2.11* 2.66* 2.27*   Estimated Creatinine Clearance: 34.5 ml/min (by C-G formula based on Cr of 2.27).  Recent Labs  11/09/12 0755 11/09/12 2012  GENTRANDOM 7.2 1.9    Microbiology: 6/26 Urine Culture: No growth (final)  Anti-infectives   Start     Dose/Rate Route Frequency Ordered Stop   11/09/12 1800  vancomycin (VANCOCIN) IVPB 750 mg/150 ml premix     750 mg 150 mL/hr over 60 Minutes Intravenous Every 24 hours 11/08/12 1929     11/08/12 2000  gentamicin (GARAMYCIN) 460 mg in dextrose 5 % 100 mL IVPB     460 mg 111.5 mL/hr over 60 Minutes Intravenous  Once 11/08/12 1926 11/08/12 2109   11/08/12 1830  vancomycin (VANCOCIN) IVPB 1000 mg/200 mL premix     1,000 mg 200 mL/hr over 60 Minutes Intravenous  Once 11/08/12 1812 11/08/12 1925      Assessment: 37 yoF presenting with bilateral flank pain starting 2 days PTA. PMH significant for left vesico ureteral reflux. Patient was given Rocephin x 1 with prescription for Bactrim DS at urgent care. Pain worsened and pt was admitted on 6/26 for treatment of pyelonephritis. Urology following and started vancomycin and gentamicin per pharmacy dosing 6/26.   Acute renal failure w/ chronic atrophy of the right kidney.   SCr mildly improved with CrCl ~ 35 ml/min (CG) and ~38 ml/min/1.55m3 (normalized).  Tmax 101.3, currently 99.5; WBC improved to 9.8  Urine culture w/o growth.  Day #3 Vancomycin and Gentamicin.  Initial gent level was elevated at 7.9 at 12 hours, decreased to 1.9 at 24 hours and level today is in process.    Goal of Therapy:  Vancomycin trough level 15-20 mcg/ml Gentamicin trough level <1 mcg/ml  Plan:   Resume Gentamicin based on levels.  Continue Vancomycin 750mg  IV q24h.  Follow up renal function.  Would recommend a less nephrotoxic agent once culture results return.   Lynann Beaver PharmD, BCPS Pager [phone] 11/10/2012 2:21 PM

## 2012-11-11 LAB — BASIC METABOLIC PANEL
BUN: 13 mg/dL (ref 6–23)
Calcium: 8.8 mg/dL (ref 8.4–10.5)
GFR calc non Af Amer: 30 mL/min — ABNORMAL LOW (ref >90)
Glucose, Bld: 115 mg/dL — ABNORMAL HIGH (ref 70–99)

## 2012-11-11 MED ORDER — SULFAMETHOXAZOLE-TMP DS 800-160 MG PO TABS
1.0000 | ORAL_TABLET | Freq: Two times a day (BID) | ORAL | Status: DC
  Filled 2012-11-11: qty 1

## 2012-11-11 MED ORDER — SULFAMETHOXAZOLE-TMP DS 800-160 MG PO TABS: 1.0000 | ORAL_TABLET | Freq: Two times a day (BID) | ORAL | Status: DC

## 2012-11-11 MED ORDER — SULFAMETHOXAZOLE-TRIMETHOPRIM 400-80 MG PO TABS
1.0000 | ORAL_TABLET | Freq: Two times a day (BID) | ORAL | Status: DC
  Administered 2012-11-11: 1 via ORAL
  Filled 2012-11-11 (×2): qty 1

## 2012-11-11 MED ORDER — SULFAMETHOXAZOLE-TRIMETHOPRIM 400-80 MG PO TABS: 1.0000 | ORAL_TABLET | Freq: Two times a day (BID) | ORAL | Status: DC

## 2012-11-11 MED ORDER — SULFAMETHOXAZOLE-TRIMETHOPRIM 400-80 MG PO TABS
1.0000 | ORAL_TABLET | Freq: Once | ORAL | Status: AC
  Administered 2012-11-11: 1 via ORAL
  Filled 2012-11-11: qty 1

## 2012-11-11 NOTE — Discharge Summary (Signed)
Patient ID: Sandra Carpenter MRN: [id number] DOB/AGE: 05/20/1975 37 y.o.  Admit date: 11/08/2012 Discharge date: 11/11/2012  Primary Care Physician:  Hollice Espy, MD  Discharge Diagnoses:   Acute pyelonephritis with renal abscess  Consults:  None   Discharge Medications:   Medication List         amLODipine 5 MG tablet  Commonly known as:  NORVASC  Take 5 mg by mouth daily.     hydrochlorothiazide 25 MG tablet  Commonly known as:  HYDRODIURIL  Take 25 mg by mouth daily.     HYDROcodone-acetaminophen 5-325 MG per tablet  Commonly known as:  NORCO/VICODIN  Take 1 tablet by mouth every 4 (four) hours as needed for pain.     sulfamethoxazole-trimethoprim 400-80 MG per tablet  Commonly known as:  BACTRIM,SEPTRA  Take 1 tablet by mouth every 12 (twelve) hours.     sulfamethoxazole-trimethoprim 800-160 MG per tablet  Commonly known as:  BACTRIM DS  Take 1 tablet by mouth every 12 (twelve) hours.         Significant Diagnostic Studies:  Ct Abdomen Pelvis Wo Contrast  11/09/2012   *RADIOLOGY REPORT*  Clinical Data: Left pyelonephritis.  Evaluate for abscess.  History of reflux.  CT ABDOMEN AND PELVIS WITHOUT CONTRAST  Technique:  Multidetector CT imaging of the abdomen and pelvis was performed following the standard protocol without intravenous contrast.  Comparison: None available.  Report from nuclear medicine renal imaging 11/26/2001 correlated.  Findings: There is dependent atelectasis at both lung bases.  Trace pleural fluid is present on the left.  There is no pericardial effusion.  There is marked chronic atrophy of the right kidney (previously reported).  The right kidney measures approximately 4.8 cm in length.  The left kidney is enlarged, measuring 13.3 cm in length. There is mild perinephric soft tissue stranding without focal perinephric fluid collection.  There is ill-defined low density in the upper pole of the left kidney, measuring 1.4 cm transverse on image  20.  There is a nonobstructing 2 mm calculus in the interpolar region of the left kidney on image 27.  The left renal pelvis and ureter are minimally dilated with mild wall thickening. No ureteral or bladder calculus is identified.  There are pelvic phleboliths bilaterally.  There is hepatic low density consistent with steatosis.  No focal liver lesions are identified.  The spleen, gallbladder, pancreas and adrenal glands appear unremarkable.  No bowel abnormalities or enlarged lymph nodes are identified.  The uterus is prominent without apparent focal abnormality.  There is no adnexal mass or adjacent inflammatory change.  There are no worrisome osseous findings.  IMPRESSION:  1.  Enlargement of the left kidney and perinephric soft tissue stranding suspicious for pyelonephritis.  (Some of the renal enlargement could be secondary to compensatory hypertrophy.) There is ill-defined low density in the upper pole of the left kidney which could reflect a small abscess.  No perinephric fluid collection identified. 2.  Mild collecting system dilatation and wall thickening, likely inflammatory.  No ureteral calculus identified. 3.  Chronic atrophy of the right kidney. 4.  Hepatic steatosis. 5.  Generalized prominence of the uterus, suggesting possible fibroids or adenomyosis.   Original Report Authenticated By: Carey Bullocks, M.D.    Brief H and P: For complete details please refer to admission H and P, but in brief the pt is admitted with fever and flank pain--possible pyelonephritis.  Hospital Course:  The patient was admitted for iv hydration, pain management and antibiotic therapy.  She was placed on vancomycin and gentamicin. CT revealed proabable pyelonephritis with small intarenal abscess. She improved on therapy, and was advanced to a regular diet. She became comfortable, more active and her creatinine improved. She was discharged on 6/29.  Day of Discharge BP 131/71  Pulse 96  Temp(Src) 99 F (37.2 C)  (Oral)  Resp 20  Ht 5\' 6"  (1.676 m)  Wt 166 lb (75.297 kg)  BMI 26.81 kg/m2  SpO2 96%  LMP 10/18/2012  Results for orders placed during the hospital encounter of 11/08/12 (from the past 24 hour(s))  BASIC METABOLIC PANEL     Status: Abnormal   Collection Time    11/11/12  5:12 AM      Result Value Range   Sodium 134 (*) 135 - 145 mEq/L   Potassium 3.9  3.5 - 5.1 mEq/L   Chloride 102  96 - 112 mEq/L   CO2 24  19 - 32 mEq/L   Glucose, Bld 115 (*) 70 - 99 mg/dL   BUN 13  6 - 23 mg/dL   Creatinine, Ser 6.43 (*) 0.50 - 1.10 mg/dL   Calcium 8.8  8.4 - 32.9 mg/dL   GFR calc non Af Amer 30 (*) >90 mL/min   GFR calc Af Amer 35 (*) >90 mL/min    Physical Exam: General: Alert and awake oriented x3 not in any acute distress. HEENT: anicteric sclera, pupils reactive to light and accommodation CVS: S1-S2 clear no murmur rubs or gallops Chest: clear to auscultation bilaterally, no wheezing rales or rhonchi Abdomen: soft nontender, nondistended, normal bowel sounds, no organomegaly Extremities: no cyanosis, clubbing or edema noted bilaterally Neuro: Cranial nerves II-XII intact, no focal neurological deficits  Disposition:  Home  Diet:  No restrictions  Activity:  Gradually increase   Disposition and Follow-up:    Followup w/ Dr. Brunilda Payor  TESTS THAT NEED FOLLOW-UP    DISCHARGE FOLLOW-UP     Follow-up Information   Follow up with NESI,MARC-HENRY, MD. (call for appointment)    Contact information:   [address] [place] [address] [phone]       Time spent on Discharge:  20 minuts  Signed: Chelsea Aus 11/11/2012, 2:42 PM

## 2012-11-11 NOTE — Progress Notes (Signed)
Patient discharged to home with family, discharge instructions reviewed with patient who verbalized understanding. RX for Antibiotic and Zofran called to [facility] by Dr Hillis Range.

## 2012-11-11 NOTE — Progress Notes (Signed)
Subjective: Patient reports feeling better  Objective: Vital signs in last 24 hours: Temp:  [98.6 F (37 C)-100.3 F (37.9 C)] 98.8 F (37.1 C) (06/29 0650) Pulse Rate:  [92-112] 92 (06/29 0650) Resp:  [18-20] 20 (06/29 0650) BP: (108-137)/(54-77) 108/54 mmHg (06/29 0650) SpO2:  [99 %-100 %] 99 % (06/29 0650)  Intake/Output from previous day: 06/28 0701 - 06/29 0700 In: 3456.9 [P.O.:360; I.V.:2985.4; IV Piggyback:111.5] Out: 1200 [Urine:1200] Intake/Output this shift: Total I/O In: 1907.8 [P.O.:240; I.V.:1556.3; IV Piggyback:111.5] Out: 900 [Urine:900]  Physical Exam:  Constitutional: Vital signs reviewed. WD WN in NAD   Eyes: PERRL, No scleral icterus.     Lab Results:  Recent Labs  11/08/12 1755 11/10/12 0534  HGB 9.0* 8.0*  HCT 27.8* 24.4*   BMET  Recent Labs  11/10/12 0534 11/11/12 0512  NA 130* 134*  K 4.0 3.9  CL 100 102  CO2 22 24  GLUCOSE 146* 115*  BUN 17 13  CREATININE 2.27* 1.99*  CALCIUM 8.7 8.8    Recent Labs  11/08/12 1755  INR 1.32   No results found for this basename: LABURIN,  in the last 72 hours Results for orders placed during the hospital encounter of 11/08/12  URINE CULTURE     Status: None   Collection Time    11/08/12  8:09 PM      Result Value Range Status   Specimen Description URINE, CLEAN CATCH   Final   Special Requests NONE   Final   Culture  Setup Time 11/09/2012 02:18   Final   Colony Count NO GROWTH   Final   Culture NO GROWTH   Final   Report Status 11/10/2012 FINAL   Final    Studies/Results: Ct Abdomen Pelvis Wo Contrast  11/09/2012   *RADIOLOGY REPORT*  Clinical Data: Left pyelonephritis.  Evaluate for abscess.  History of reflux.  CT ABDOMEN AND PELVIS WITHOUT CONTRAST  Technique:  Multidetector CT imaging of the abdomen and pelvis was performed following the standard protocol without intravenous contrast.  Comparison: None available.  Report from nuclear medicine renal imaging 11/26/2001 correlated.   Findings: There is dependent atelectasis at both lung bases.  Trace pleural fluid is present on the left.  There is no pericardial effusion.  There is marked chronic atrophy of the right kidney (previously reported).  The right kidney measures approximately 4.8 cm in length.  The left kidney is enlarged, measuring 13.3 cm in length. There is mild perinephric soft tissue stranding without focal perinephric fluid collection.  There is ill-defined low density in the upper pole of the left kidney, measuring 1.4 cm transverse on image 20.  There is a nonobstructing 2 mm calculus in the interpolar region of the left kidney on image 27.  The left renal pelvis and ureter are minimally dilated with mild wall thickening. No ureteral or bladder calculus is identified.  There are pelvic phleboliths bilaterally.  There is hepatic low density consistent with steatosis.  No focal liver lesions are identified.  The spleen, gallbladder, pancreas and adrenal glands appear unremarkable.  No bowel abnormalities or enlarged lymph nodes are identified.  The uterus is prominent without apparent focal abnormality.  There is no adnexal mass or adjacent inflammatory change.  There are no worrisome osseous findings.  IMPRESSION:  1.  Enlargement of the left kidney and perinephric soft tissue stranding suspicious for pyelonephritis.  (Some of the renal enlargement could be secondary to compensatory hypertrophy.) There is ill-defined low density in the upper pole  of the left kidney which could reflect a small abscess.  No perinephric fluid collection identified. 2.  Mild collecting system dilatation and wall thickening, likely inflammatory.  No ureteral calculus identified. 3.  Chronic atrophy of the right kidney. 4.  Hepatic steatosis. 5.  Generalized prominence of the uterus, suggesting possible fibroids or adenomyosis.   Original Report Authenticated By: Carey Bullocks, M.D.    Assessment/Plan:  Acute left pyelonephritis. She is  improving clinically, with regards to her creatinine. She is tolerating her diet and is hungry this morning. Urine culture revealed no growth. She has been on broad-spectrum antibiotics. I will saline lock IV, discontinue gentamicin and vancomycin and put her on Septra.    LOS: 3 days   Marcine Matar M 11/11/2012, 6:53 AM

## 2013-01-29 ENCOUNTER — Other Ambulatory Visit (HOSPITAL_COMMUNITY): Payer: Self-pay

## 2013-01-30 ENCOUNTER — Encounter (HOSPITAL_COMMUNITY): Payer: Managed Care, Other (non HMO)

## 2013-02-11 ENCOUNTER — Ambulatory Visit (HOSPITAL_COMMUNITY)
Admission: RE | Admit: 2013-02-11 | Discharge: 2013-02-11 | Disposition: A | Discharge status: Home / Self Care | Payer: Managed Care, Other (non HMO) | Source: Ambulatory Visit | Attending: Nephrology | Admitting: Nephrology

## 2013-02-11 DIAGNOSIS — D509 Iron deficiency anemia, unspecified: Secondary | ICD-10-CM | POA: Insufficient documentation

## 2013-02-11 MED ORDER — SODIUM CHLORIDE 0.9 % IV SOLN
1020.0000 mg | Freq: Once | INTRAVENOUS | Status: AC
  Administered 2013-02-11: 10:00:00 1020 mg via INTRAVENOUS
  Filled 2013-02-11: qty 34

## 2013-02-11 MED ORDER — SODIUM CHLORIDE 0.9 % IV SOLN: INTRAVENOUS | Status: DC

## 2014-08-18 ENCOUNTER — Other Ambulatory Visit: Payer: Self-pay | Admitting: Gynecology

## 2014-08-18 DIAGNOSIS — R928 Other abnormal and inconclusive findings on diagnostic imaging of breast: Secondary | ICD-10-CM

## 2014-08-29 ENCOUNTER — Other Ambulatory Visit: Payer: Managed Care, Other (non HMO)

## 2014-09-12 ENCOUNTER — Ambulatory Visit
Admission: RE | Admit: 2014-09-12 | Discharge: 2014-09-12 | Disposition: A | Discharge status: Home / Self Care | Payer: BLUE CROSS/BLUE SHIELD | Source: Ambulatory Visit | Attending: Gynecology | Admitting: Gynecology

## 2014-09-12 DIAGNOSIS — R928 Other abnormal and inconclusive findings on diagnostic imaging of breast: Secondary | ICD-10-CM

## 2015-01-12 ENCOUNTER — Telehealth: Payer: Self-pay | Admitting: Hematology

## 2015-01-12 NOTE — Telephone Encounter (Signed)
Called pt second time to schedule new patient appt. Unable to leave second message.

## 2015-05-30 ENCOUNTER — Emergency Department (HOSPITAL_COMMUNITY): Payer: BLUE CROSS/BLUE SHIELD

## 2015-05-30 ENCOUNTER — Encounter (HOSPITAL_COMMUNITY): Payer: Self-pay

## 2015-05-30 ENCOUNTER — Emergency Department (HOSPITAL_COMMUNITY)
Admission: EM | Admit: 2015-05-30 | Discharge: 2015-05-31 | Disposition: A | Discharge status: Home / Self Care | Payer: BLUE CROSS/BLUE SHIELD | Attending: Emergency Medicine | Admitting: Emergency Medicine

## 2015-05-30 DIAGNOSIS — I129 Hypertensive chronic kidney disease with stage 1 through stage 4 chronic kidney disease, or unspecified chronic kidney disease: Secondary | ICD-10-CM | POA: Insufficient documentation

## 2015-05-30 DIAGNOSIS — Z3202 Encounter for pregnancy test, result negative: Secondary | ICD-10-CM | POA: Insufficient documentation

## 2015-05-30 DIAGNOSIS — R000 Tachycardia, unspecified: Secondary | ICD-10-CM | POA: Diagnosis not present

## 2015-05-30 DIAGNOSIS — Z880 Allergy status to penicillin: Secondary | ICD-10-CM | POA: Insufficient documentation

## 2015-05-30 DIAGNOSIS — Z79899 Other long term (current) drug therapy: Secondary | ICD-10-CM | POA: Insufficient documentation

## 2015-05-30 DIAGNOSIS — R109 Unspecified abdominal pain: Secondary | ICD-10-CM | POA: Diagnosis present

## 2015-05-30 DIAGNOSIS — Z9851 Tubal ligation status: Secondary | ICD-10-CM | POA: Insufficient documentation

## 2015-05-30 DIAGNOSIS — N12 Tubulo-interstitial nephritis, not specified as acute or chronic: Secondary | ICD-10-CM | POA: Diagnosis not present

## 2015-05-30 DIAGNOSIS — Z9889 Other specified postprocedural states: Secondary | ICD-10-CM | POA: Diagnosis not present

## 2015-05-30 DIAGNOSIS — N189 Chronic kidney disease, unspecified: Secondary | ICD-10-CM | POA: Diagnosis not present

## 2015-05-30 HISTORY — DX: Tubulo-interstitial nephritis, not specified as acute or chronic: N12

## 2015-05-30 LAB — URINALYSIS, ROUTINE W REFLEX MICROSCOPIC
BILIRUBIN URINE: NEGATIVE
GLUCOSE, UA: NEGATIVE mg/dL
KETONES UR: NEGATIVE mg/dL
Nitrite: NEGATIVE
PH: 6 (ref 5.0–8.0)
Protein, ur: NEGATIVE mg/dL
SPECIFIC GRAVITY, URINE: 1.012 (ref 1.005–1.030)

## 2015-05-30 LAB — CBC
HCT: 32 % — ABNORMAL LOW (ref 36.0–46.0)
Hemoglobin: 10.2 g/dL — ABNORMAL LOW (ref 12.0–15.0)
MCH: 23.6 pg — AB (ref 26.0–34.0)
MCHC: 31.9 g/dL (ref 30.0–36.0)
MCV: 74.1 fL — AB (ref 78.0–100.0)
PLATELETS: 324 10*3/uL (ref 150–400)
RBC: 4.32 MIL/uL (ref 3.87–5.11)
RDW: 16.6 % — AB (ref 11.5–15.5)
WBC: 14.5 10*3/uL — ABNORMAL HIGH (ref 4.0–10.5)

## 2015-05-30 LAB — COMPREHENSIVE METABOLIC PANEL
ALBUMIN: 4.1 g/dL (ref 3.5–5.0)
ALK PHOS: 72 U/L (ref 38–126)
ALT: 13 U/L — AB (ref 14–54)
ANION GAP: 12 (ref 5–15)
AST: 18 U/L (ref 15–41)
BILIRUBIN TOTAL: 0.8 mg/dL (ref 0.3–1.2)
BUN: 15 mg/dL (ref 6–20)
CALCIUM: 9.5 mg/dL (ref 8.9–10.3)
CO2: 26 mmol/L (ref 22–32)
CREATININE: 1.45 mg/dL — AB (ref 0.44–1.00)
Chloride: 99 mmol/L — ABNORMAL LOW (ref 101–111)
GFR calc Af Amer: 51 mL/min — ABNORMAL LOW (ref >60)
GFR calc non Af Amer: 44 mL/min — ABNORMAL LOW (ref >60)
GLUCOSE: 104 mg/dL — AB (ref 65–99)
Potassium: 3.3 mmol/L — ABNORMAL LOW (ref 3.5–5.1)
SODIUM: 137 mmol/L (ref 135–145)
TOTAL PROTEIN: 8.8 g/dL — AB (ref 6.5–8.1)

## 2015-05-30 LAB — URINE MICROSCOPIC-ADD ON

## 2015-05-30 LAB — I-STAT CG4 LACTIC ACID, ED: Lactic Acid, Venous: 0.95 mmol/L (ref 0.5–2.0)

## 2015-05-30 LAB — POC URINE PREG, ED: Preg Test, Ur: NEGATIVE

## 2015-05-30 MED ORDER — SODIUM CHLORIDE 0.9 % IV BOLUS (SEPSIS)
1000.0000 mL | Freq: Once | INTRAVENOUS | Status: AC
  Administered 2015-05-30: 1000 mL via INTRAVENOUS

## 2015-05-30 MED ORDER — ONDANSETRON HCL 4 MG/2ML IJ SOLN
4.0000 mg | Freq: Once | INTRAMUSCULAR | Status: AC
  Administered 2015-05-30: 4 mg via INTRAVENOUS
  Filled 2015-05-30: qty 2

## 2015-05-30 MED ORDER — ACETAMINOPHEN 500 MG PO TABS
1000.0000 mg | ORAL_TABLET | Freq: Once | ORAL | Status: AC
  Administered 2015-05-30: 1000 mg via ORAL
  Filled 2015-05-30: qty 2

## 2015-05-30 MED ORDER — ONDANSETRON 4 MG PO TBDP: 4.0000 mg | ORAL_TABLET | Freq: Three times a day (TID) | ORAL | Status: DC | PRN

## 2015-05-30 MED ORDER — CIPROFLOXACIN HCL 500 MG PO TABS: 500.0000 mg | ORAL_TABLET | Freq: Two times a day (BID) | ORAL | Status: DC

## 2015-05-30 MED ORDER — ACETAMINOPHEN 500 MG PO TABS: 1000.0000 mg | ORAL_TABLET | Freq: Once | ORAL | Status: DC

## 2015-05-30 MED ORDER — CIPROFLOXACIN IN D5W 400 MG/200ML IV SOLN
400.0000 mg | Freq: Once | INTRAVENOUS | Status: AC
  Administered 2015-05-30: 400 mg via INTRAVENOUS
  Filled 2015-05-30: qty 200

## 2015-05-30 MED ORDER — ACETAMINOPHEN 500 MG PO TABS
1000.0000 mg | ORAL_TABLET | Freq: Once | ORAL | Status: DC
  Filled 2015-05-30: qty 2

## 2015-05-30 MED ORDER — ACETAMINOPHEN 500 MG PO TABS
500.0000 mg | ORAL_TABLET | Freq: Once | ORAL | Status: AC
Start: 2015-05-30 — End: 2015-05-30
  Administered 2015-05-30: 500 mg via ORAL
  Filled 2015-05-30: qty 1

## 2015-05-30 NOTE — ED Notes (Signed)
She c/o back pain nausea/h/a and waxing and waning fever x 2 days.  Seen at Urgent Care today, who dx with pyelo.   She states she has very limited right kidney function and has both a urologist and a nephrologist.  She states she was hospitalized 2 years ago for similar s/s.

## 2015-05-30 NOTE — ED Provider Notes (Signed)
CSN: [id number]     Arrival date & time 05/30/15  1837 History   First MD Initiated Contact with Patient 05/30/15 1931     Chief Complaint  Patient presents with  . Pyelonephritis      HPI  Patient position evaluation of a kidney infection. States she's had symptoms for last 2 days states "just felt like my body was falling apart". She further describes this as body aches and some right flank pain. Seen in urgent care today and diagnosed with urinary tract infection. She reports a history of chronic renal insufficiency follows with urology and nephrology. Had an episode 2 years ago that was similar although much worse that she was hospitalized for. She has normal pain. No nausea vomiting. Fever shakes and chills today.  Past Medical History  Diagnosis Date  . Hypertension   . Chronic kidney disease   . Pyelonephritis    Past Surgical History  Procedure Laterality Date  . Cesarean section    . Tubal ligation     No family history on file. Social History  Substance Use Topics  . Smoking status: Never Smoker   . Smokeless tobacco: None  . Alcohol Use: Yes     Comment: occassional    OB History    No data available     Review of Systems  Constitutional: Positive for fever and chills. Negative for diaphoresis, appetite change and fatigue.  HENT: Negative for mouth sores, sore throat and trouble swallowing.   Eyes: Negative for visual disturbance.  Respiratory: Negative for cough, chest tightness, shortness of breath and wheezing.   Cardiovascular: Negative for chest pain.  Gastrointestinal: Negative for nausea, vomiting, abdominal pain, diarrhea and abdominal distention.  Endocrine: Negative for polydipsia, polyphagia and polyuria.  Genitourinary: Positive for flank pain. Negative for dysuria, frequency and hematuria.  Musculoskeletal: Negative for gait problem.  Skin: Negative for color change, pallor and rash.  Neurological: Negative for dizziness, syncope,  light-headedness and headaches.  Hematological: Does not bruise/bleed easily.  Psychiatric/Behavioral: Negative for behavioral problems and confusion.      Allergies  Penicillins  Home Medications   Prior to Admission medications   Medication Sig Start Date End Date Taking? Authorizing Provider  amLODipine (NORVASC) 5 MG tablet Take 5 mg by mouth daily.   Yes Historical Provider, MD  hydrochlorothiazide (HYDRODIURIL) 25 MG tablet Take 25 mg by mouth daily.   Yes Historical Provider, MD  lubiprostone (AMITIZA) 24 MCG capsule Take 24 mcg by mouth 2 (two) times daily with a meal.   Yes Historical Provider, MD  ciprofloxacin (CIPRO) 500 MG tablet Take 1 tablet (500 mg total) by mouth every 12 (twelve) hours. 05/30/15   Rolland PorterMark Christo Hain, MD  ondansetron (ZOFRAN ODT) 4 MG disintegrating tablet Take 1 tablet (4 mg total) by mouth every 8 (eight) hours as needed for nausea. 05/30/15   Rolland PorterMark Jodey Burbano, MD   BP 127/67 mmHg  Pulse 119  Temp(Src) 100.5 F (38.1 C) (Oral)  Resp 18  Ht 5\' 6"  (1.676 m)  Wt 175 lb (79.379 kg)  BMI 28.26 kg/m2  SpO2 95%  LMP 04/30/2015 Physical Exam  Constitutional: She is oriented to person, place, and time. She appears well-developed and well-nourished. No distress.  HENT:  Head: Normocephalic.  Eyes: Conjunctivae are normal. Pupils are equal, round, and reactive to light. No scleral icterus.  Neck: Normal range of motion. Neck supple. No thyromegaly present.  Cardiovascular: Regular rhythm.  Tachycardia present.  Exam reveals no gallop and no friction rub.  No murmur heard. Pulmonary/Chest: Effort normal and breath sounds normal. No respiratory distress. She has no wheezes. She has no rales.  Abdominal: Soft. Bowel sounds are normal. She exhibits no distension. There is no tenderness. There is no rebound.  Right flank pain. Not particular tender.  Musculoskeletal: Normal range of motion.  Neurological: She is alert and oriented to person, place, and time.  Skin:  Skin is warm and dry. No rash noted.  Psychiatric: She has a normal mood and affect. Her behavior is normal.    ED Course  Procedures (including critical care time) Labs Review Labs Reviewed  URINALYSIS, ROUTINE W REFLEX MICROSCOPIC (NOT AT [facility]) - Abnormal; Notable for the following:    Hgb urine dipstick SMALL (*)    Leukocytes, UA SMALL (*)    All other components within normal limits  COMPREHENSIVE METABOLIC PANEL - Abnormal; Notable for the following:    Potassium 3.3 (*)    Chloride 99 (*)    Glucose, Bld 104 (*)    Creatinine, Ser 1.45 (*)    Total Protein 8.8 (*)    ALT 13 (*)    GFR calc non Af Amer 44 (*)    GFR calc Af Amer 51 (*)    All other components within normal limits  CBC - Abnormal; Notable for the following:    WBC 14.5 (*)    Hemoglobin 10.2 (*)    HCT 32.0 (*)    MCV 74.1 (*)    MCH 23.6 (*)    RDW 16.6 (*)    All other components within normal limits  URINE MICROSCOPIC-ADD ON - Abnormal; Notable for the following:    Squamous Epithelial / LPF 0-5 (*)    Bacteria, UA RARE (*)    All other components within normal limits  CULTURE, BLOOD (ROUTINE X 2)  CULTURE, BLOOD (ROUTINE X 2)  URINE CULTURE  POC URINE PREG, ED  I-STAT CG4 LACTIC ACID, ED  I-STAT CG4 LACTIC ACID, ED    Imaging Review Dg Chest 2 View  05/30/2015  CLINICAL DATA:  Acute onset of generalized back pain, nausea, headache and fever. Sepsis. Initial encounter. EXAM: CHEST  2 VIEW COMPARISON:  None. FINDINGS: The lungs are well-aerated and clear. There is no evidence of focal opacification, pleural effusion or pneumothorax. The heart is normal in size; the mediastinal contour is within normal limits. No acute osseous abnormalities are seen. IMPRESSION: No acute cardiopulmonary process seen. Electronically Signed   By: Roanna Raider M.D.   On: 05/30/2015 19:38   I have personally reviewed and evaluated these images and lab results as part of my medical decision-making.   EKG  Interpretation None      MDM   Final diagnoses:  Pyelonephritis    Heart rate improves with fluid. Tubes are improved. Ultimately she was given ylenol as well. Recheck shows him to decrease to 100.3. Heart rate 101. States she feels better and does desire to go home. Cautioned her that she has any difficulty keeping down fluids or medications at home to return here immediately.    Rolland Porter, MD 05/30/15 2342

## 2015-05-30 NOTE — Discharge Instructions (Signed)
Push fluids, stay hydrated. Return to ER with vomiting, pain, or any new, or worsening symptoms.   Pyelonephritis, Adult Pyelonephritis is a kidney infection. The kidneys are the organs that filter a person's blood and move waste out of the bloodstream and into the urine. Urine passes from the kidneys, through the ureters, and into the bladder. There are two main types of pyelonephritis:  Infections that come on quickly without any warning (acute pyelonephritis).  Infections that last for a long period of time (chronic pyelonephritis). In most cases, the infection clears up with treatment and does not cause further problems. More severe infections or chronic infections can sometimes spread to the bloodstream or lead to other problems with the kidneys. CAUSES This condition is usually caused by:  Bacteria traveling from the bladder to the kidney through infected urine. The urine in the bladder can become infected with bacteria from:  Bladder infection (cystitis).  Inflammation of the prostate gland (prostatitis).  Sexual intercourse, in females.  Bacteria traveling from the bloodstream to the kidney. RISK FACTORS This condition is more likely to develop in:  Pregnant women.  Older people.  People who have diabetes.  People who have kidney stones or bladder stones.  People who have other abnormalities of the kidney or ureter.  People who have a catheter placed in the bladder.  People who have cancer.  People who are sexually active.  Women who use spermicides.  People who have had a prior urinary tract infection. SYMPTOMS Symptoms of this condition include:  Frequent urination.  Strong or persistent urge to urinate.  Burning or stinging when urinating.  Abdominal pain.  Back pain.  Pain in the side or flank area.  Fever.  Chills.  Blood in the urine, or dark urine.  Nausea.  Vomiting. DIAGNOSIS This condition may be diagnosed based on:  Medical  history and physical exam.  Urine tests.  Blood tests. You may also have imaging tests of the kidneys, such as an ultrasound or CT scan. TREATMENT Treatment for this condition may depend on the severity of the infection.  If the infection is mild and is found early, you may be treated with antibiotic medicines taken by mouth. You will need to drink fluids to remain hydrated.  If the infection is more severe, you may need to stay in the hospital and receive antibiotics given directly into a vein through an IV tube. You may also need to receive fluids through an IV tube if you are not able to remain hydrated. After your hospital stay, you may need to take oral antibiotics for a period of time. Other treatments may be required, depending on the cause of the infection. HOME CARE INSTRUCTIONS Medicines  Take over-the-counter and prescription medicines only as told by your health care provider.  If you were prescribed an antibiotic medicine, take it as told by your health care provider. Do not stop taking the antibiotic even if you start to feel better. General Instructions  Drink enough fluid to keep your urine clear or pale yellow.  Avoid caffeine, tea, and carbonated beverages. They tend to irritate the bladder.  Urinate often. Avoid holding in urine for long periods of time.  Urinate before and after sex.  After a bowel movement, women should cleanse from front to back. Use each tissue only once.  Keep all follow-up visits as told by your health care provider. This is important. SEEK MEDICAL CARE IF:  Your symptoms do not get better after 2 days of  treatment.  Your symptoms get worse.  You have a fever. SEEK IMMEDIATE MEDICAL CARE IF:  You are unable to take your antibiotics or fluids.  You have shaking chills.  You vomit.  You have severe flank or back pain.  You have extreme weakness or fainting.   This information is not intended to replace advice given to you by  your health care provider. Make sure you discuss any questions you have with your health care provider.   Document Released: 05/02/2005 Document Revised: 01/21/2015 Document Reviewed: 08/25/2014 Elsevier Interactive Patient Education [employer]

## 2015-06-01 LAB — URINE CULTURE

## 2015-06-05 LAB — CULTURE, BLOOD (ROUTINE X 2): Culture: NO GROWTH

## 2015-08-25 DIAGNOSIS — B9789 Other viral agents as the cause of diseases classified elsewhere: Secondary | ICD-10-CM | POA: Diagnosis not present

## 2015-08-25 DIAGNOSIS — J029 Acute pharyngitis, unspecified: Secondary | ICD-10-CM | POA: Diagnosis not present

## 2015-08-25 DIAGNOSIS — J329 Chronic sinusitis, unspecified: Secondary | ICD-10-CM | POA: Diagnosis not present

## 2016-01-20 DIAGNOSIS — R7303 Prediabetes: Secondary | ICD-10-CM | POA: Diagnosis not present

## 2016-01-20 DIAGNOSIS — N898 Other specified noninflammatory disorders of vagina: Secondary | ICD-10-CM | POA: Diagnosis not present

## 2016-01-20 DIAGNOSIS — I10 Essential (primary) hypertension: Secondary | ICD-10-CM | POA: Diagnosis not present

## 2016-01-20 DIAGNOSIS — Z23 Encounter for immunization: Secondary | ICD-10-CM | POA: Diagnosis not present

## 2016-02-10 DIAGNOSIS — Z862 Personal history of diseases of the blood and blood-forming organs and certain disorders involving the immune mechanism: Secondary | ICD-10-CM | POA: Diagnosis not present

## 2016-02-10 DIAGNOSIS — I10 Essential (primary) hypertension: Secondary | ICD-10-CM | POA: Diagnosis not present

## 2016-02-10 DIAGNOSIS — Z6830 Body mass index (BMI) 30.0-30.9, adult: Secondary | ICD-10-CM | POA: Diagnosis not present

## 2016-02-10 DIAGNOSIS — N183 Chronic kidney disease, stage 3 (moderate): Secondary | ICD-10-CM | POA: Diagnosis not present

## 2016-05-07 DIAGNOSIS — J209 Acute bronchitis, unspecified: Secondary | ICD-10-CM | POA: Diagnosis not present

## 2016-06-23 DIAGNOSIS — Z01411 Encounter for gynecological examination (general) (routine) with abnormal findings: Secondary | ICD-10-CM | POA: Diagnosis not present

## 2016-06-23 DIAGNOSIS — Z1231 Encounter for screening mammogram for malignant neoplasm of breast: Secondary | ICD-10-CM | POA: Diagnosis not present

## 2016-06-23 DIAGNOSIS — Z124 Encounter for screening for malignant neoplasm of cervix: Secondary | ICD-10-CM | POA: Diagnosis not present

## 2016-06-23 DIAGNOSIS — N898 Other specified noninflammatory disorders of vagina: Secondary | ICD-10-CM | POA: Diagnosis not present

## 2016-06-23 DIAGNOSIS — Z6829 Body mass index (BMI) 29.0-29.9, adult: Secondary | ICD-10-CM | POA: Diagnosis not present

## 2016-07-20 DIAGNOSIS — D509 Iron deficiency anemia, unspecified: Secondary | ICD-10-CM | POA: Diagnosis not present

## 2016-07-20 DIAGNOSIS — B079 Viral wart, unspecified: Secondary | ICD-10-CM | POA: Diagnosis not present

## 2016-07-20 DIAGNOSIS — R5383 Other fatigue: Secondary | ICD-10-CM | POA: Diagnosis not present

## 2016-07-20 DIAGNOSIS — R7303 Prediabetes: Secondary | ICD-10-CM | POA: Diagnosis not present

## 2016-07-20 DIAGNOSIS — I10 Essential (primary) hypertension: Secondary | ICD-10-CM | POA: Diagnosis not present

## 2016-08-24 DIAGNOSIS — B079 Viral wart, unspecified: Secondary | ICD-10-CM | POA: Diagnosis not present

## 2016-08-24 DIAGNOSIS — R7303 Prediabetes: Secondary | ICD-10-CM | POA: Diagnosis not present

## 2016-08-24 DIAGNOSIS — D509 Iron deficiency anemia, unspecified: Secondary | ICD-10-CM | POA: Diagnosis not present

## 2016-08-25 DIAGNOSIS — N920 Excessive and frequent menstruation with regular cycle: Secondary | ICD-10-CM | POA: Diagnosis not present

## 2016-08-25 DIAGNOSIS — N879 Dysplasia of cervix uteri, unspecified: Secondary | ICD-10-CM | POA: Diagnosis not present

## 2016-09-07 DIAGNOSIS — N898 Other specified noninflammatory disorders of vagina: Secondary | ICD-10-CM | POA: Diagnosis not present

## 2016-10-26 DIAGNOSIS — R7303 Prediabetes: Secondary | ICD-10-CM | POA: Diagnosis not present

## 2016-10-26 DIAGNOSIS — D509 Iron deficiency anemia, unspecified: Secondary | ICD-10-CM | POA: Diagnosis not present

## 2016-11-22 DIAGNOSIS — R3915 Urgency of urination: Secondary | ICD-10-CM | POA: Diagnosis not present

## 2016-11-22 DIAGNOSIS — N390 Urinary tract infection, site not specified: Secondary | ICD-10-CM | POA: Diagnosis not present

## 2016-12-08 ENCOUNTER — Encounter: Payer: Self-pay | Admitting: Hematology and Oncology

## 2016-12-08 ENCOUNTER — Telehealth: Payer: Self-pay | Admitting: Hematology and Oncology

## 2016-12-08 NOTE — Telephone Encounter (Signed)
Appt has been scheduled for the pt to see Dr. Gweneth DimitriPerlov on 8/13 at 11am. Pt aware to arrive 15 minutes early. Address and insurance verified. Letter mailed to the pt and faxed to the referring.

## 2016-12-26 ENCOUNTER — Telehealth: Payer: Self-pay | Admitting: Hematology and Oncology

## 2016-12-26 ENCOUNTER — Ambulatory Visit (HOSPITAL_BASED_OUTPATIENT_CLINIC_OR_DEPARTMENT_OTHER): Payer: BLUE CROSS/BLUE SHIELD | Admitting: Hematology and Oncology

## 2016-12-26 ENCOUNTER — Ambulatory Visit (HOSPITAL_BASED_OUTPATIENT_CLINIC_OR_DEPARTMENT_OTHER): Payer: BLUE CROSS/BLUE SHIELD

## 2016-12-26 VITALS — BP 125/83 | HR 76 | Temp 98.9°F | Resp 20 | Ht 66.0 in | Wt 185.2 lb

## 2016-12-26 DIAGNOSIS — D509 Iron deficiency anemia, unspecified: Secondary | ICD-10-CM | POA: Diagnosis not present

## 2016-12-26 LAB — COMPREHENSIVE METABOLIC PANEL
ALBUMIN: 3.8 g/dL (ref 3.5–5.0)
ALK PHOS: 72 U/L (ref 40–150)
ALT: 13 U/L (ref 0–55)
AST: 16 U/L (ref 5–34)
Anion Gap: 7 mEq/L (ref 3–11)
BILIRUBIN TOTAL: 0.49 mg/dL (ref 0.20–1.20)
BUN: 13.2 mg/dL (ref 7.0–26.0)
CALCIUM: 10 mg/dL (ref 8.4–10.4)
CO2: 31 mEq/L — ABNORMAL HIGH (ref 22–29)
Chloride: 101 mEq/L (ref 98–109)
Creatinine: 1.1 mg/dL (ref 0.6–1.1)
EGFR: 70 mL/min/{1.73_m2} — AB (ref >90)
GLUCOSE: 86 mg/dL (ref 70–140)
POTASSIUM: 3.6 meq/L (ref 3.5–5.1)
SODIUM: 140 meq/L (ref 136–145)
TOTAL PROTEIN: 8 g/dL (ref 6.4–8.3)

## 2016-12-26 LAB — IRON AND TIBC
%SAT: 9 % — ABNORMAL LOW (ref 21–57)
IRON: 24 ug/dL — AB (ref 41–142)
TIBC: 258 ug/dL (ref 236–444)
UIBC: 234 ug/dL (ref 120–384)

## 2016-12-26 LAB — CBC & DIFF AND RETIC
BASO%: 0.2 % (ref 0.0–2.0)
Basophils Absolute: 0 10*3/uL (ref 0.0–0.1)
EOS%: 3.4 % (ref 0.0–7.0)
Eosinophils Absolute: 0.2 10*3/uL (ref 0.0–0.5)
HCT: 34 % — ABNORMAL LOW (ref 34.8–46.6)
HGB: 10.4 g/dL — ABNORMAL LOW (ref 11.6–15.9)
IMMATURE RETIC FRACT: 15 % — AB (ref 1.60–10.00)
LYMPH#: 1.7 10*3/uL (ref 0.9–3.3)
LYMPH%: 33.9 % (ref 14.0–49.7)
MCH: 23 pg — ABNORMAL LOW (ref 25.1–34.0)
MCHC: 30.6 g/dL — AB (ref 31.5–36.0)
MCV: 75.1 fL — ABNORMAL LOW (ref 79.5–101.0)
MONO#: 0.4 10*3/uL (ref 0.1–0.9)
MONO%: 7.7 % (ref 0.0–14.0)
NEUT%: 54.8 % (ref 38.4–76.8)
NEUTROS ABS: 2.8 10*3/uL (ref 1.5–6.5)
Platelets: 329 10*3/uL (ref 145–400)
RBC: 4.53 10*6/uL (ref 3.70–5.45)
RDW: 19.1 % — AB (ref 11.2–14.5)
RETIC CT ABS: 62.51 10*3/uL (ref 33.70–90.70)
Retic %: 1.38 % (ref 0.70–2.10)
WBC: 5.1 10*3/uL (ref 3.9–10.3)

## 2016-12-26 LAB — LACTATE DEHYDROGENASE: LDH: 130 U/L (ref 125–245)

## 2016-12-26 LAB — FERRITIN: Ferritin: 74 ng/ml (ref 9–269)

## 2016-12-26 MED ORDER — FERROUS SULFATE 325 (65 FE) MG PO TBEC: 325.0000 mg | DELAYED_RELEASE_TABLET | Freq: Three times a day (TID) | ORAL | 0 refills | Status: DC

## 2016-12-26 NOTE — Telephone Encounter (Signed)
Gave patient avs and calendar for upcoming appts.  °

## 2016-12-27 ENCOUNTER — Encounter: Payer: Self-pay | Admitting: Hematology and Oncology

## 2016-12-27 DIAGNOSIS — D509 Iron deficiency anemia, unspecified: Secondary | ICD-10-CM | POA: Insufficient documentation

## 2016-12-27 LAB — ANTINUCLEAR ANTIBODIES, IFA: ANA Titer 1: NEGATIVE

## 2016-12-27 LAB — SEDIMENTATION RATE: Sedimentation Rate-Westergren: 27 mm/hr (ref 0–32)

## 2016-12-27 LAB — DIRECT ANTIGLOBULIN TEST (NOT AT ARMC): COOMBS', DIRECT: NEGATIVE

## 2016-12-27 LAB — HAPTOGLOBIN: HAPTOGLOBIN: 173 mg/dL (ref 34–200)

## 2016-12-27 LAB — RHEUMATOID FACTOR: RHEUMATOID FACTOR: 12.7 [IU]/mL (ref 0.0–13.9)

## 2016-12-27 LAB — CERULOPLASMIN: Ceruloplasmin: 34.8 mg/dL (ref 19.0–39.0)

## 2016-12-27 LAB — C-REACTIVE PROTEIN: CRP: 7.1 mg/L — ABNORMAL HIGH (ref 0.0–4.9)

## 2016-12-27 NOTE — Progress Notes (Signed)
[facility] New Visit:  Assessment: Microcytic anemia 41 year old female with iron deficiency and mild microcytic, hypochromic anemia likely attributable to the same. Source of deficiency and iron is not entirely clear at this point in time, but will need to be additionally evaluated. Currently, patient is receiving iron supplementation, but is only taking 1 pill of iron per day with a total dose of 125 mg which is likely insufficient to improve her iron stores rapidly enough.  Plan: --Labs today to evaluate for potential additional causes of anemia such as copper deficiency, inflammatory state, and autoimmune conditions --Start ferrous sulfate 325 mg 3 times a day. If patient has difficulty tolerating ferrous sulfate, we'll transition her to ferrous gluconate at the same dose and frequency. --Return to clinic in 1 month with lab work to review the iron storage and anemia status.  Voice recognition software was used and creation of this note. Despite my best effort at editing the text, some misspelling/errors may have occurred.  Orders Placed This Encounter  Procedures  . CBC & Diff and Retic    Standing Status:   Future    Number of Occurrences:   1    Standing Expiration Date:   12/26/2017  . Lactate dehydrogenase (LDH)    Standing Status:   Future    Number of Occurrences:   1    Standing Expiration Date:   12/26/2017  . Haptoglobin    Standing Status:   Future    Number of Occurrences:   1    Standing Expiration Date:   12/26/2017  . Ferritin    Standing Status:   Future    Number of Occurrences:   1    Standing Expiration Date:   12/26/2017  . Iron and TIBC    Standing Status:   Future    Number of Occurrences:   1    Standing Expiration Date:   12/26/2017  . Comprehensive metabolic panel    Standing Status:   Future    Number of Occurrences:   1    Standing Expiration Date:   12/26/2017  . ANA, IFA (with reflex)    Standing Status:   Future    Number of  Occurrences:   1    Standing Expiration Date:   12/26/2017  . Rheumatoid factor    Standing Status:   Future    Number of Occurrences:   1    Standing Expiration Date:   12/26/2017  . Copper, serum    Standing Status:   Future    Number of Occurrences:   1    Standing Expiration Date:   12/26/2017  . Ceruloplasmin    Standing Status:   Future    Number of Occurrences:   1    Standing Expiration Date:   12/26/2017  . CBC & Diff and Retic    Standing Status:   Future    Standing Expiration Date:   12/26/2017  . Ferritin    Standing Status:   Future    Standing Expiration Date:   12/26/2017  . Iron and TIBC    Standing Status:   Future    Standing Expiration Date:   12/26/2017  . Sedimentation rate    Standing Status:   Future    Number of Occurrences:   1    Standing Expiration Date:   12/26/2017  . C-reactive protein    Standing Status:   Future    Number of Occurrences:  1    Standing Expiration Date:   12/26/2017  . Direct antiglobulin test (Coombs)    Standing Status:   Future    Number of Occurrences:   1    Standing Expiration Date:   12/26/2017    All questions were answered.   The patient knows to call the clinic with any problems, questions or concerns.  This note was electronically signed.    History of Presenting Illness Sandra Carpenter is a 41 y.o. female presenting to the [facility] for evaluation of anemia. She reports a long-term diagnosis of iron deficiency anemia, currently receiving Integra supplement contains iron 295MW and folic acid. She is taking 1 pill a day. She does complain of symptoms of fatigue, but denies any shortness of breath, chest pain, or cough. She does report intermittent burning or sensation in the tongue but never had ulcers in the mouth. Is complaining of excessive thirst, but denies any dry mouth or dry eyes. Denies any sun sensitivity, but describes burning and itching of the skin with cold exposure. She also acknowledges easy  bruisability. No nausea, early satiety, abdominal pain, diarrhea, or constipation.  Review of past medical history significant for chronic kidney disease, stage III, hypertension, history of pyelonephritis and chronic UTI due to vesicoureteral reflux. Patient has chronic constipation and migraines. Family history significant for aunt with systemic lupus.   Oncological/hematological History: --Labs, 07/20/16: WBC 6.2, Hgb 10.4, MCV 71.5, MCH 22.2, RDW 20.5, Plt 342; Fe 13, FeSat 4%, TIBC 305, Ferritin 32.8; TSH 1.88 --Labs, 10/26/16: WBC 6.5, Hgb 10.4, MCV 73.2, MCH 23.1, RDW 20.2, Plt 317; Fe 17, FeSat 6%, TIBC 292, Ferritin ...;   Medical History: Past Medical History:  Diagnosis Date  . Chronic kidney disease   . Hypertension   . Pyelonephritis     Surgical History: Past Surgical History:  Procedure Laterality Date  . CESAREAN SECTION    . TUBAL LIGATION      Family History: No family history on file.  Social History: Social History   Social History  . Marital status: Married    Spouse name: N/A  . Number of children: N/A  . Years of education: N/A   Occupational History  . Not on file.   Social History Main Topics  . Smoking status: Never Smoker  . Smokeless tobacco: Not on file  . Alcohol use Yes     Comment: occassional   . Drug use: No  . Sexual activity: Yes   Other Topics Concern  . Not on file   Social History Narrative  . No narrative on file    Allergies: Allergies  Allergen Reactions  . Penicillins     Mouth breaks out Has patient had a PCN reaction causing immediate rash, facial/tongue/throat swelling, SOB or lightheadedness with hypotension:no Has patient had a PCN reaction causing severe rash involving mucus membranes or skin necrosis: yes minor rash Has patient had a PCN reaction that required hospitalization: no Has patient had a PCN reaction occurring within the last 10 years: no If all of the above answers are "NO", then may proceed  with Cephalosporin use.     Medications:  Current Outpatient Prescriptions  Medication Sig Dispense Refill  . amLODipine (NORVASC) 5 MG tablet Take 5 mg by mouth daily.    . ciprofloxacin (CIPRO) 500 MG tablet Take 1 tablet (500 mg total) by mouth every 12 (twelve) hours. 20 tablet 0  . ferrous sulfate 325 (65 FE) MG EC tablet Take 1 tablet (  325 mg total) by mouth 3 (three) times daily with meals. 90 tablet 0  . hydrochlorothiazide (HYDRODIURIL) 25 MG tablet Take 25 mg by mouth daily.    Marland Kitchen lubiprostone (AMITIZA) 24 MCG capsule Take 24 mcg by mouth 2 (two) times daily with a meal.    . ondansetron (ZOFRAN ODT) 4 MG disintegrating tablet Take 1 tablet (4 mg total) by mouth every 8 (eight) hours as needed for nausea. 6 tablet 0   No current facility-administered medications for this visit.     Review of Systems: Review of Systems  Constitutional: Positive for fatigue. Negative for appetite change, chills, diaphoresis, fever and unexpected weight change.  HENT:  Negative.   Eyes: Negative.   Respiratory: Negative.   Cardiovascular: Negative.   Gastrointestinal: Negative.   Endocrine: Negative.   Genitourinary: Negative.    Musculoskeletal: Negative.   Skin: Negative.   Neurological: Negative.   Hematological: Bruises/bleeds easily.  Psychiatric/Behavioral: Negative.      PHYSICAL EXAMINATION Blood pressure 125/83, pulse 76, temperature 98.9 F (37.2 C), temperature source Oral, resp. rate 20, height _0  (1.676 m), weight 185 lb 3.2 oz (84 kg), SpO2 98 %.  ECOG PERFORMANCE STATUS: 1 - Symptomatic but completely ambulatory  Physical Exam  Constitutional: She is oriented to person, place, and time and well-developed, well-nourished, and in no distress. No distress.  HENT:  Head: Normocephalic and atraumatic.  Mouth/Throat: Oropharynx is clear and moist. No oropharyngeal exudate.  Eyes: Pupils are equal, round, and reactive to light. Conjunctivae are normal. Right eye exhibits  no discharge. No scleral icterus.  Neck: No thyromegaly present.  Cardiovascular: Normal rate and regular rhythm.   No murmur heard. Pulmonary/Chest: Effort normal and breath sounds normal. No respiratory distress. She has no wheezes. She has no rales.  Abdominal: Soft. She exhibits no distension and no mass. There is no tenderness. There is no rebound and no guarding.  Musculoskeletal: Normal range of motion. She exhibits no edema.  Lymphadenopathy:    She has no cervical adenopathy.  Neurological: She is alert and oriented to person, place, and time. She has normal reflexes. No cranial nerve deficit.  Skin: Skin is warm. No rash noted. She is not diaphoretic. No erythema. No pallor.     LABORATORY DATA: I have personally reviewed the data as listed: Appointment on 12/26/2016  Component Date Value Ref Range Status  . WBC 12/26/2016 5.1  3.9 - 10.3 10e3/uL Final  . NEUT# 12/26/2016 2.8  1.5 - 6.5 10e3/uL Final  . HGB 12/26/2016 10.4* 11.6 - 15.9 g/dL Final  . HCT 12/26/2016 34.0* 34.8 - 46.6 % Final  . Platelets 12/26/2016 329  145 - 400 10e3/uL Final  . MCV 12/26/2016 75.1* 79.5 - 101.0 fL Final  . MCH 12/26/2016 23.0* 25.1 - 34.0 pg Final  . MCHC 12/26/2016 30.6* 31.5 - 36.0 g/dL Final  . RBC 12/26/2016 4.53  3.70 - 5.45 10e6/uL Final  . RDW 12/26/2016 19.1* 11.2 - 14.5 % Final  . lymph# 12/26/2016 1.7  0.9 - 3.3 10e3/uL Final  . MONO# 12/26/2016 0.4  0.1 - 0.9 10e3/uL Final  . Eosinophils Absolute 12/26/2016 0.2  0.0 - 0.5 10e3/uL Final  . Basophils Absolute 12/26/2016 0.0  0.0 - 0.1 10e3/uL Final  . NEUT% 12/26/2016 54.8  38.4 - 76.8 % Final  . LYMPH% 12/26/2016 33.9  14.0 - 49.7 % Final  . MONO% 12/26/2016 7.7  0.0 - 14.0 % Final  . EOS% 12/26/2016 3.4  0.0 - 7.0 %  Final  . BASO% 12/26/2016 0.2  0.0 - 2.0 % Final  . Retic % 12/26/2016 1.38  0.70 - 2.10 % Final  . Retic Ct Abs 12/26/2016 62.51  33.70 - 90.70 10e3/uL Final  . Immature Retic Fract 12/26/2016 15.00* 1.60 -  10.00 % Final  . LDH 12/26/2016 130  125 - 245 U/L Final  . Haptoglobin 12/26/2016 173  34 - 200 mg/dL Final  . Ferritin 12/26/2016 74  9 - 269 ng/ml Final  . Iron 12/26/2016 24* 41 - 142 ug/dL Final  . TIBC 12/26/2016 258  236 - 444 ug/dL Final  . UIBC 12/26/2016 234  120 - 384 ug/dL Final  . %SAT 12/26/2016 9* 21 - 57 % Final  . Coombs', Direct 12/26/2016 Negative  Negative Final  . Sodium 12/26/2016 140  136 - 145 mEq/L Final  . Potassium 12/26/2016 3.6  3.5 - 5.1 mEq/L Final  . Chloride 12/26/2016 101  98 - 109 mEq/L Final  . CO2 12/26/2016 31* 22 - 29 mEq/L Final  . Glucose 12/26/2016 86  70 - 140 mg/dl Final   Glucose reference range is for nonfasting patients. Fasting glucose reference range is 70- 100.  Marland Kitchen BUN 12/26/2016 13.2  7.0 - 26.0 mg/dL Final  . Creatinine 12/26/2016 1.1  0.6 - 1.1 mg/dL Final  . Total Bilirubin 12/26/2016 0.49  0.20 - 1.20 mg/dL Final  . Alkaline Phosphatase 12/26/2016 72  40 - 150 U/L Final  . AST 12/26/2016 16  5 - 34 U/L Final  . ALT 12/26/2016 13  0 - 55 U/L Final  . Total Protein 12/26/2016 8.0  6.4 - 8.3 g/dL Final  . Albumin 12/26/2016 3.8  3.5 - 5.0 g/dL Final  . Calcium 12/26/2016 10.0  8.4 - 10.4 mg/dL Final  . Anion Gap 12/26/2016 7  3 - 11 mEq/L Final  . EGFR 12/26/2016 70* >90 ml/min/1.73 m2 Final   eGFR is calculated using the CKD-EPI Creatinine Equation (2009)  . ANA Titer 1 12/26/2016 Negative   Final   Comment:                                                     Negative   <1:80                                                     Borderline  1:80                                                     Positive   >1:80   . RA Latex Turbid. 12/26/2016 12.7  0.0 - 13.9 IU/mL Final  . Ceruloplasmin 12/26/2016 34.8  19.0 - 39.0 mg/dL Final  . Sedimentation Rate-Westergren 12/26/2016 27  0 - 32 mm/hr Final  . CRP 12/26/2016 7.1* 0.0 - 4.9 mg/L Final        Ardath Sax, MD

## 2016-12-27 NOTE — Assessment & Plan Note (Signed)
41 year old female with iron deficiency and mild microcytic, hypochromic anemia likely attributable to the same. Source of deficiency and iron is not entirely clear at this point in time, but will need to be additionally evaluated. Currently, patient is receiving iron supplementation, but is only taking 1 pill of iron per day with a total dose of 125 mg which is likely insufficient to improve her iron stores rapidly enough.  Plan: --Labs today to evaluate for potential additional causes of anemia such as copper deficiency, inflammatory state, and autoimmune conditions --Start ferrous sulfate 325 mg 3 times a day. If patient has difficulty tolerating ferrous sulfate, we'll transition her to ferrous gluconate at the same dose and frequency. --Return to clinic in 1 month with lab work to review the iron storage and anemia status.

## 2016-12-28 LAB — COPPER, SERUM: Copper: 143 ug/dL (ref 72–166)

## 2017-01-13 ENCOUNTER — Telehealth: Payer: Self-pay | Admitting: Hematology and Oncology

## 2017-01-13 NOTE — Telephone Encounter (Signed)
Called patient but number was invalid. Will mail calendar of updated schedule to patient.

## 2017-01-17 ENCOUNTER — Other Ambulatory Visit: Payer: BLUE CROSS/BLUE SHIELD

## 2017-01-17 ENCOUNTER — Ambulatory Visit: Payer: BLUE CROSS/BLUE SHIELD | Admitting: Hematology and Oncology

## 2017-01-23 ENCOUNTER — Other Ambulatory Visit: Payer: BLUE CROSS/BLUE SHIELD

## 2017-01-23 ENCOUNTER — Ambulatory Visit: Payer: BLUE CROSS/BLUE SHIELD | Admitting: Hematology and Oncology

## 2017-01-25 ENCOUNTER — Ambulatory Visit (HOSPITAL_BASED_OUTPATIENT_CLINIC_OR_DEPARTMENT_OTHER): Payer: BLUE CROSS/BLUE SHIELD | Admitting: Hematology and Oncology

## 2017-01-25 ENCOUNTER — Other Ambulatory Visit (HOSPITAL_BASED_OUTPATIENT_CLINIC_OR_DEPARTMENT_OTHER): Payer: BLUE CROSS/BLUE SHIELD

## 2017-01-25 ENCOUNTER — Telehealth: Payer: Self-pay | Admitting: Hematology and Oncology

## 2017-01-25 VITALS — BP 134/83 | HR 75 | Temp 98.2°F | Ht 66.0 in | Wt 183.6 lb

## 2017-01-25 DIAGNOSIS — D509 Iron deficiency anemia, unspecified: Secondary | ICD-10-CM

## 2017-01-25 LAB — CBC & DIFF AND RETIC
BASO%: 0.2 % (ref 0.0–2.0)
BASOS ABS: 0 10*3/uL (ref 0.0–0.1)
EOS ABS: 0.2 10*3/uL (ref 0.0–0.5)
EOS%: 3.1 % (ref 0.0–7.0)
HEMATOCRIT: 34.5 % — AB (ref 34.8–46.6)
HEMOGLOBIN: 10.5 g/dL — AB (ref 11.6–15.9)
IMMATURE RETIC FRACT: 13.6 % — AB (ref 1.60–10.00)
LYMPH%: 31 % (ref 14.0–49.7)
MCH: 23.2 pg — AB (ref 25.1–34.0)
MCHC: 30.4 g/dL — ABNORMAL LOW (ref 31.5–36.0)
MCV: 76.3 fL — ABNORMAL LOW (ref 79.5–101.0)
MONO#: 0.4 10*3/uL (ref 0.1–0.9)
MONO%: 7 % (ref 0.0–14.0)
NEUT#: 3.5 10*3/uL (ref 1.5–6.5)
NEUT%: 58.7 % (ref 38.4–76.8)
Platelets: 318 10*3/uL (ref 145–400)
RBC: 4.52 10*6/uL (ref 3.70–5.45)
RDW: 19.4 % — AB (ref 11.2–14.5)
RETIC %: 1.52 % (ref 0.70–2.10)
Retic Ct Abs: 68.7 10*3/uL (ref 33.70–90.70)
WBC: 5.9 10*3/uL (ref 3.9–10.3)
lymph#: 1.8 10*3/uL (ref 0.9–3.3)

## 2017-01-25 LAB — FERRITIN: Ferritin: 81 ng/ml (ref 9–269)

## 2017-01-25 LAB — IRON AND TIBC
%SAT: 7 % — AB (ref 21–57)
IRON: 18 ug/dL — AB (ref 41–142)
TIBC: 253 ug/dL (ref 236–444)
UIBC: 234 ug/dL (ref 120–384)

## 2017-01-25 MED ORDER — FERROUS SULFATE 325 (65 FE) MG PO TBEC: 325.0000 mg | DELAYED_RELEASE_TABLET | Freq: Three times a day (TID) | ORAL | 0 refills | Status: AC

## 2017-01-25 NOTE — Telephone Encounter (Signed)
Gave patient AVS and calendar of upcoming November appointment.  °

## 2017-01-27 NOTE — Assessment & Plan Note (Signed)
41 year old female with iron deficiency and mild microcytic, hypochromic anemia likely attributable to the same. Source of deficiency and iron is not entirely clear at this point in time. Additional evaluation did not reveal any additional source of blood loss or copper deficiency. It does demonstrate persistent inflammatory state in the body suggesting concurrent presence off of chronic disease contributing secondary to anemia.  Anemia remains stable, with logical parameters otherwise are improving.  Plan: --Continue ferrous sulfate 325 mg BID. --Return to clinic in 3 month with lab work to review the iron storage and anemia status.

## 2017-01-27 NOTE — Progress Notes (Signed)
[facility] Follow-up Visit:  Assessment: Microcytic anemia 41 year old female with iron deficiency and mild microcytic, hypochromic anemia likely attributable to the same. Source of deficiency and iron is not entirely clear at this point in time. Additional evaluation did not reveal any additional source of blood loss or copper deficiency. It does demonstrate persistent inflammatory state in the body suggesting concurrent presence off of chronic disease contributing secondary to anemia.  Anemia remains stable, with logical parameters otherwise are improving.  Plan: --Continue ferrous sulfate 325 mg BID. --Return to clinic in 3 month with lab work to review the iron storage and anemia status.  Voice recognition software was used and creation of this note. Despite my best effort at editing the text, some misspelling/errors may have occurred.  Orders Placed This Encounter  Procedures  . CBC & Diff and Retic    Standing Status:   Future    Standing Expiration Date:   01/25/2018  . Ferritin    Standing Status:   Future    Standing Expiration Date:   01/25/2018  . Iron and TIBC    Standing Status:   Future    Standing Expiration Date:   01/25/2018  . Comprehensive metabolic panel    Standing Status:   Future    Standing Expiration Date:   01/25/2018   All questions were answered.  . The patient knows to call the clinic with any problems, questions or concerns.  This note was electronically signed.    History of Presenting Illness Sandra Carpenter is a 41 y.o. female [facility]deficiency anemia. She initially reported a long-term diagnosis of iron deficiency anemia, receiving Integra supplement containing iron  and folic acid. She was taking 1 pill a day. She did complain of symptoms of fatigue, but denied any shortness of breath, chest pain, or cough. She did report intermittent burning sensation in the tongue but never had ulcers  in the mouth. She also has complained of excessive thirst, but denied any dry mouth or dry eyes. Denied any sun sensitivity, but described burning and itching of the skin with cold exposure. She also acknowledged easy bruisability. No nausea, early satiety, abdominal pain, diarrhea, or constipation. Review of past medical history was significant for chronic kidney disease, stage III, hypertension, history of pyelonephritis and chronic UTI due to vesicoureteral reflux. Patient has chronic constipation and migraines. Family history significant for aunt with systemic lupus.  Last visit to the clinic, patient was started on oral iron supplementation with ferrous sulfate. Currently, patient has been taking 2 tablets of ferrous sulfate per day. She denies any new symptoms and denies any side effects of the medication. He denies any new complaints   Oncological/hematological History: --Labs, 07/20/16: WBC 6.2, Hgb 10.4, MCV 71.5, MCH 22.2, RDW 20.5, Plt 342; Fe 13, FeSat 4%, TIBC 305, Ferritin 32.8; TSH 1.88 --Labs, 10/26/16: WBC 6.5, Hgb 10.4, MCV 73.2, MCH 23.1, RDW 20.2, Plt 317; Fe 17, FeSat 6%, TIBC 292, Ferritin ...; --Labs, 12/26/16: WBC 5.1, Hgb 10.4, MCV 75.1, MCH 23.0, RDW 19.1, Plt 329; Fe 24, FeSat 9%, TIBC 258, Ferritin 74, Copper 143, Ceruloplasmin 34.8; CRP 7.1, LDH 130, Haptoglobin 173, DAT negative, ANA & RF -- negative; --Labs, 01/25/17: WBC 5.9, Hgb 10.5, MCV 76.3, MCH 23.2, RDW 19.4, Plt 318; Fe 18, FeSat 7%, TIBC 253, Ferritin 81;  Medical History: Past Medical History:  Diagnosis Date  . Chronic kidney disease   . Hypertension   . Pyelonephritis  Surgical History: Past Surgical History:  Procedure Laterality Date  . CESAREAN SECTION    . TUBAL LIGATION      Family History: No family history on file.  Social History: Social History   Social History  . Marital status: Married    Spouse name: N/A  . Number of children: N/A  . Years of education: N/A    Occupational History  . Not on file.   Social History Main Topics  . Smoking status: Never Smoker  . Smokeless tobacco: Never Used  . Alcohol use Yes     Comment: occassional   . Drug use: No  . Sexual activity: Yes   Other Topics Concern  . Not on file   Social History Narrative  . No narrative on file    Allergies: Allergies  Allergen Reactions  . Penicillins     Mouth breaks out Has patient had a PCN reaction causing immediate rash, facial/tongue/throat swelling, SOB or lightheadedness with hypotension:no Has patient had a PCN reaction causing severe rash involving mucus membranes or skin necrosis: yes minor rash Has patient had a PCN reaction that required hospitalization: no Has patient had a PCN reaction occurring within the last 10 years: no If all of the above answers are "NO", then may proceed with Cephalosporin use.     Medications:  Current Outpatient Prescriptions  Medication Sig Dispense Refill  . amLODipine (NORVASC) 5 MG tablet Take 5 mg by mouth daily.    . ciprofloxacin (CIPRO) 500 MG tablet Take 1 tablet (500 mg total) by mouth every 12 (twelve) hours. 20 tablet 0  . ferrous sulfate 325 (65 FE) MG EC tablet Take 1 tablet (325 mg total) by mouth 3 (three) times daily with meals. 90 tablet 0  . hydrochlorothiazide (HYDRODIURIL) 25 MG tablet Take 25 mg by mouth daily.    Marland Kitchen lubiprostone (AMITIZA) 24 MCG capsule Take 24 mcg by mouth 2 (two) times daily with a meal.    . ondansetron (ZOFRAN ODT) 4 MG disintegrating tablet Take 1 tablet (4 mg total) by mouth every 8 (eight) hours as needed for nausea. 6 tablet 0   No current facility-administered medications for this visit.     Review of Systems: Review of Systems  Constitutional: Positive for fatigue. Negative for appetite change, chills, diaphoresis, fever and unexpected weight change.  HENT:  Negative.   Eyes: Negative.   Respiratory: Negative.   Cardiovascular: Negative.   Gastrointestinal:  Negative.   Endocrine: Negative.   Genitourinary: Negative.    Musculoskeletal: Negative.   Skin: Negative.   Neurological: Negative.   Hematological: Bruises/bleeds easily.  Psychiatric/Behavioral: Negative.      PHYSICAL EXAMINATION Blood pressure 134/83, pulse 75, temperature 98.2 F (36.8 C), temperature source Oral, height  (1.676 m), weight 183 lb 9.6 oz (83.3 kg), SpO2 99 %.  ECOG PERFORMANCE STATUS: 1 - Symptomatic but completely ambulatory  Physical Exam  Constitutional: She is oriented to person, place, and time and well-developed, well-nourished, and in no distress. No distress.  HENT:  Head: Normocephalic and atraumatic.  Mouth/Throat: Oropharynx is clear and moist. No oropharyngeal exudate.  Eyes: Pupils are equal, round, and reactive to light. Conjunctivae are normal. Right eye exhibits no discharge. No scleral icterus.  Neck: No thyromegaly present.  Cardiovascular: Normal rate and regular rhythm.   No murmur heard. Pulmonary/Chest: Effort normal and breath sounds normal. No respiratory distress. She has no wheezes. She has no rales.  Abdominal: Soft. She exhibits no distension and no mass.  There is no tenderness. There is no rebound and no guarding.  Musculoskeletal: Normal range of motion. She exhibits no edema.  Lymphadenopathy:    She has no cervical adenopathy.  Neurological: She is alert and oriented to person, place, and time. She has normal reflexes. No cranial nerve deficit.  Skin: Skin is warm. No rash noted. She is not diaphoretic. No erythema. No pallor.     LABORATORY DATA: I have personally reviewed the data as listed: Appointment on 01/25/2017  Component Date Value Ref Range Status  . WBC 01/25/2017 5.9  3.9 - 10.3 10e3/uL Final  . NEUT# 01/25/2017 3.5  1.5 - 6.5 10e3/uL Final  . HGB 01/25/2017 10.5* 11.6 - 15.9 g/dL Final  . HCT 91/47/8295 34.5* 34.8 - 46.6 % Final  . Platelets 01/25/2017 318  145 - 400 10e3/uL Final  . MCV 01/25/2017  76.3* 79.5 - 101.0 fL Final  . MCH 01/25/2017 23.2* 25.1 - 34.0 pg Final  . MCHC 01/25/2017 30.4* 31.5 - 36.0 g/dL Final  . RBC 62/13/0865 4.52  3.70 - 5.45 10e6/uL Final  . RDW 01/25/2017 19.4* 11.2 - 14.5 % Final  . lymph# 01/25/2017 1.8  0.9 - 3.3 10e3/uL Final  . MONO# 01/25/2017 0.4  0.1 - 0.9 10e3/uL Final  . Eosinophils Absolute 01/25/2017 0.2  0.0 - 0.5 10e3/uL Final  . Basophils Absolute 01/25/2017 0.0  0.0 - 0.1 10e3/uL Final  . NEUT% 01/25/2017 58.7  38.4 - 76.8 % Final  . LYMPH% 01/25/2017 31.0  14.0 - 49.7 % Final  . MONO% 01/25/2017 7.0  0.0 - 14.0 % Final  . EOS% 01/25/2017 3.1  0.0 - 7.0 % Final  . BASO% 01/25/2017 0.2  0.0 - 2.0 % Final  . Retic % 01/25/2017 1.52  0.70 - 2.10 % Final  . Retic Ct Abs 01/25/2017 68.70  33.70 - 90.70 10e3/uL Final  . Immature Retic Fract 01/25/2017 13.60* 1.60 - 10.00 % Final  . Ferritin 01/25/2017 81  9 - 269 ng/ml Final  . Iron 01/25/2017 18* 41 - 142 ug/dL Final  . TIBC 78/46/9629 253  236 - 444 ug/dL Final  . UIBC 52/84/1324 234  120 - 384 ug/dL Final  . %SAT 40/02/2724 7* 21 - 57 % Final       Daisy Blossom, MD

## 2017-02-02 DIAGNOSIS — Z23 Encounter for immunization: Secondary | ICD-10-CM | POA: Diagnosis not present

## 2017-02-02 DIAGNOSIS — D509 Iron deficiency anemia, unspecified: Secondary | ICD-10-CM | POA: Diagnosis not present

## 2017-02-02 DIAGNOSIS — I10 Essential (primary) hypertension: Secondary | ICD-10-CM | POA: Diagnosis not present

## 2017-02-02 DIAGNOSIS — R7303 Prediabetes: Secondary | ICD-10-CM | POA: Diagnosis not present

## 2017-02-06 DIAGNOSIS — Z862 Personal history of diseases of the blood and blood-forming organs and certain disorders involving the immune mechanism: Secondary | ICD-10-CM | POA: Diagnosis not present

## 2017-02-06 DIAGNOSIS — Z6830 Body mass index (BMI) 30.0-30.9, adult: Secondary | ICD-10-CM | POA: Diagnosis not present

## 2017-02-06 DIAGNOSIS — I10 Essential (primary) hypertension: Secondary | ICD-10-CM | POA: Diagnosis not present

## 2017-02-06 DIAGNOSIS — N183 Chronic kidney disease, stage 3 (moderate): Secondary | ICD-10-CM | POA: Diagnosis not present

## 2017-02-24 DIAGNOSIS — K5909 Other constipation: Secondary | ICD-10-CM | POA: Diagnosis not present

## 2017-02-24 DIAGNOSIS — D509 Iron deficiency anemia, unspecified: Secondary | ICD-10-CM | POA: Diagnosis not present

## 2017-04-12 ENCOUNTER — Other Ambulatory Visit: Payer: BLUE CROSS/BLUE SHIELD

## 2017-04-19 ENCOUNTER — Ambulatory Visit: Payer: BLUE CROSS/BLUE SHIELD | Admitting: Hematology and Oncology

## 2017-07-06 DIAGNOSIS — R69 Illness, unspecified: Secondary | ICD-10-CM | POA: Diagnosis not present

## 2017-07-06 DIAGNOSIS — R509 Fever, unspecified: Secondary | ICD-10-CM | POA: Diagnosis not present

## 2017-07-11 ENCOUNTER — Emergency Department (HOSPITAL_COMMUNITY): Payer: BLUE CROSS/BLUE SHIELD

## 2017-07-11 ENCOUNTER — Emergency Department (HOSPITAL_COMMUNITY)
Admission: EM | Admit: 2017-07-11 | Discharge: 2017-07-12 | Disposition: A | Discharge status: Home / Self Care | Payer: BLUE CROSS/BLUE SHIELD | Attending: Emergency Medicine | Admitting: Emergency Medicine

## 2017-07-11 ENCOUNTER — Encounter (HOSPITAL_COMMUNITY): Payer: Self-pay

## 2017-07-11 DIAGNOSIS — J209 Acute bronchitis, unspecified: Secondary | ICD-10-CM | POA: Diagnosis not present

## 2017-07-11 DIAGNOSIS — R0602 Shortness of breath: Secondary | ICD-10-CM | POA: Diagnosis not present

## 2017-07-11 DIAGNOSIS — J40 Bronchitis, not specified as acute or chronic: Secondary | ICD-10-CM | POA: Diagnosis not present

## 2017-07-11 DIAGNOSIS — Z79899 Other long term (current) drug therapy: Secondary | ICD-10-CM | POA: Insufficient documentation

## 2017-07-11 DIAGNOSIS — R05 Cough: Secondary | ICD-10-CM | POA: Diagnosis not present

## 2017-07-11 DIAGNOSIS — R0789 Other chest pain: Secondary | ICD-10-CM | POA: Diagnosis not present

## 2017-07-11 DIAGNOSIS — I10 Essential (primary) hypertension: Secondary | ICD-10-CM | POA: Diagnosis not present

## 2017-07-11 MED ORDER — IPRATROPIUM-ALBUTEROL 0.5-2.5 (3) MG/3ML IN SOLN
3.0000 mL | Freq: Once | RESPIRATORY_TRACT | Status: AC
  Administered 2017-07-11: 3 mL via RESPIRATORY_TRACT
  Filled 2017-07-11: qty 3

## 2017-07-11 NOTE — ED Triage Notes (Signed)
Pt states she was seen at [facility] last week, tested negative for the flu but received tamiflu because she had a fever, pt states she has completed the tamiflu but states she's having a hard time catching her breath and she has a productive cough

## 2017-07-12 MED ORDER — ALBUTEROL SULFATE (5 MG/ML) 0.5% IN NEBU: 2.5000 mg | INHALATION_SOLUTION | RESPIRATORY_TRACT | 12 refills | Status: DC | PRN

## 2017-07-12 MED ORDER — DEXAMETHASONE 4 MG PO TABS
10.0000 mg | ORAL_TABLET | Freq: Once | ORAL | Status: AC
  Administered 2017-07-12: 10 mg via ORAL
  Filled 2017-07-12: qty 2

## 2017-07-12 MED ORDER — HYDROCOD POLST-CPM POLST ER 10-8 MG/5ML PO SUER: 5.0000 mL | Freq: Every evening | ORAL | 0 refills | Status: DC | PRN

## 2017-07-12 MED ORDER — HYDROCOD POLST-CPM POLST ER 10-8 MG/5ML PO SUER
5.0000 mL | Freq: Once | ORAL | Status: AC
  Administered 2017-07-12: 5 mL via ORAL
  Filled 2017-07-12: qty 5

## 2017-07-12 NOTE — ED Provider Notes (Signed)
[facility]-EMERGENCY DEPT Provider Note   CSN: [id number] Arrival date & time: 07/11/17  2248     History   Chief Complaint Chief Complaint  Patient presents with  . flu like sx    HPI Sandra Carpenter is a 42 y.o. female.  The history is provided by the patient and the spouse.  Cough  This is a new problem. The current episode started more than 2 days ago. The problem occurs every few minutes. The problem has been gradually worsening. The cough is productive of sputum. There has been no fever. Associated symptoms include shortness of breath. Associated symptoms comments: Chest wall pain and shortness of breath. She has tried cough syrup for the symptoms. The treatment provided no relief. She is not a smoker. Her past medical history does not include COPD or asthma.   Patient with history of hypertension presents with cough.  She reports she recently treated for flu earlier this month, but for the past several days been having increasing cough.  She has not had fevers in the past 48 hours.  She denies hemoptysis.  She reports her cough is worsening.  She reports that when she coughs she has chest wall pain.  She feels short of breath and her voice is worse.  She is been using OTC meds without improvement. Past Medical History:  Diagnosis Date  . Chronic kidney disease   . Hypertension   . Pyelonephritis     Patient Active Problem List   Diagnosis Date Noted  . Microcytic anemia 12/27/2016  . Pyelonephritis     Past Surgical History:  Procedure Laterality Date  . CESAREAN SECTION    . TUBAL LIGATION      OB History    No data available       Home Medications    Prior to Admission medications   Medication Sig Start Date End Date Taking? Authorizing Provider  albuterol (PROVENTIL) (5 MG/ML) 0.5% nebulizer solution Take 0.5 mLs (2.5 mg total) by nebulization every 4 (four) hours as needed for wheezing or shortness of breath. 07/12/17   Zadie RhineWickline,  Lennan Malone, MD  amLODipine (NORVASC) 5 MG tablet Take 5 mg by mouth daily.    [provider]  chlorpheniramine-HYDROcodone (TUSSIONEX PENNKINETIC ER) 10-8 MG/5ML SUER Take 5 mLs by mouth at bedtime as needed for cough. 07/12/17   Zadie RhineWickline, Glenda Kunst, MD  ciprofloxacin (CIPRO) 500 MG tablet Take 1 tablet (500 mg total) by mouth every 12 (twelve) hours. 05/30/15   Rolland PorterJames, Mark, MD  ferrous sulfate 325 (65 FE) MG EC tablet Take 1 tablet (325 mg total) by mouth 3 (three) times daily with meals. 01/25/17 02/24/17  Daisy BlossomPerlov, Mikhail G, MD  hydrochlorothiazide (HYDRODIURIL) 25 MG tablet Take 25 mg by mouth daily.    [provider]  lubiprostone (AMITIZA) 24 MCG capsule Take 24 mcg by mouth 2 (two) times daily with a meal.    [provider]  ondansetron (ZOFRAN ODT) 4 MG disintegrating tablet Take 1 tablet (4 mg total) by mouth every 8 (eight) hours as needed for nausea. 05/30/15   Rolland PorterJames, Mark, MD    Family History History reviewed. No pertinent family history.  Social History Social History   Tobacco Use  . Smoking status: Never Smoker  . Smokeless tobacco: Never Used  Substance Use Topics  . Alcohol use: Yes    Comment: occassional   . Drug use: No     Allergies   Penicillins   Review of Systems Review  of Systems  Constitutional: Positive for fatigue. Negative for fever.  Respiratory: Positive for cough and shortness of breath.   Gastrointestinal: Negative for vomiting.  All other systems reviewed and are negative.    Physical Exam Updated Vital Signs BP 127/89 (BP Location: Right Arm)   Pulse 83   Temp 98.3 F (36.8 C) (Oral)   Resp 20   SpO2 100%   Physical Exam CONSTITUTIONAL: Well developed/well nourished, patient is whispering during exam HEAD: Normocephalic/atraumatic EYES: EOMI/PERRL ENMT: Mucous membranes moist, no stridor, no drooling, uvula midline without erythema or exudate. NECK: supple no meningeal signs SPINE/BACK:entire spine  nontender CV: S1/S2 noted, no murmurs/rubs/gallops noted LUNGS: Coughs frequently during exam, coarse breath sounds noted bilaterally, no crackles ABDOMEN: soft, nontender GU:no cva tenderness NEURO: Pt is awake/alert/appropriate, moves all extremitiesx4.  No facial droop.   EXTREMITIES: pulses normal/equal, full ROM, no lower extremity edema SKIN: warm, color normal PSYCH: no abnormalities of mood noted, alert and oriented to situation   ED Treatments / Results  Labs (all labs ordered are listed, but only abnormal results are displayed) Labs Reviewed - No data to display  EKG  EKG Interpretation None       Radiology Dg Chest 2 View  Result Date: 07/12/2017 CLINICAL DATA:  Cough with fever EXAM: CHEST  2 VIEW COMPARISON:  05/30/2015 FINDINGS: The heart size and mediastinal contours are within normal limits. Both lungs are clear. The visualized skeletal structures are unremarkable. IMPRESSION: No active cardiopulmonary disease. Electronically Signed   By: Jasmine Pang M.D.   On: 07/12/2017 00:00    Procedures Procedures (including critical care time)  Medications Ordered in ED Medications  ipratropium-albuterol (DUONEB) 0.5-2.5 (3) MG/3ML nebulizer solution 3 mL (3 mLs Nebulization Given 07/11/17 2351)  chlorpheniramine-HYDROcodone (TUSSIONEX) 10-8 MG/5ML suspension 5 mL (5 mLs Oral Given 07/12/17 0051)  dexamethasone (DECADRON) tablet 10 mg (10 mg Oral Given 07/12/17 0051)     Initial Impression / Assessment and Plan / ED Course  I have reviewed the triage vital signs and the nursing notes.  Pertinent  imaging results that were available during my care of the patient were reviewed by me and considered in my medical decision making (see chart for details).     Patient presents with increasing cough for the past several days.  She was recently treated for flu starting November 21.  She reports that fevers have improved, but cough is not improving.  No hemoptysis.  She is  awake alert, no distress.  No hypoxia noted, no respiratory distress noted.  Suspect this is post viral bronchitis Low suspicion for PE She reports improvement with albuterol.  She has nebulizer at home.  Will give albuterol solution. Also give her cough medications as well  Final Clinical Impressions(s) / ED Diagnoses   Final diagnoses:  Bronchitis    ED Discharge Orders        Ordered    chlorpheniramine-HYDROcodone (TUSSIONEX PENNKINETIC ER) 10-8 MG/5ML SUER  At bedtime PRN     07/12/17 0059    albuterol (PROVENTIL) (5 MG/ML) 0.5% nebulizer solution  Every 4 hours PRN     07/12/17 0059       Zadie Rhine, MD 07/12/17 0110

## 2017-07-14 DIAGNOSIS — J209 Acute bronchitis, unspecified: Secondary | ICD-10-CM | POA: Diagnosis not present

## 2017-07-14 DIAGNOSIS — J329 Chronic sinusitis, unspecified: Secondary | ICD-10-CM | POA: Diagnosis not present

## 2017-08-03 DIAGNOSIS — R7303 Prediabetes: Secondary | ICD-10-CM | POA: Diagnosis not present

## 2017-08-03 DIAGNOSIS — I10 Essential (primary) hypertension: Secondary | ICD-10-CM | POA: Diagnosis not present

## 2017-09-08 DIAGNOSIS — N289 Disorder of kidney and ureter, unspecified: Secondary | ICD-10-CM | POA: Diagnosis not present

## 2017-10-17 DIAGNOSIS — Z1231 Encounter for screening mammogram for malignant neoplasm of breast: Secondary | ICD-10-CM | POA: Diagnosis not present

## 2017-10-17 DIAGNOSIS — Z124 Encounter for screening for malignant neoplasm of cervix: Secondary | ICD-10-CM | POA: Diagnosis not present

## 2017-10-17 DIAGNOSIS — N926 Irregular menstruation, unspecified: Secondary | ICD-10-CM | POA: Diagnosis not present

## 2017-10-17 DIAGNOSIS — Z6830 Body mass index (BMI) 30.0-30.9, adult: Secondary | ICD-10-CM | POA: Diagnosis not present

## 2017-10-17 DIAGNOSIS — R5383 Other fatigue: Secondary | ICD-10-CM | POA: Diagnosis not present

## 2017-10-17 DIAGNOSIS — Z01411 Encounter for gynecological examination (general) (routine) with abnormal findings: Secondary | ICD-10-CM | POA: Diagnosis not present

## 2017-11-29 ENCOUNTER — Other Ambulatory Visit: Payer: Self-pay

## 2017-12-04 ENCOUNTER — Encounter: Payer: Self-pay | Admitting: Podiatry

## 2017-12-04 ENCOUNTER — Other Ambulatory Visit: Payer: Self-pay

## 2017-12-04 ENCOUNTER — Other Ambulatory Visit: Payer: Self-pay | Admitting: Podiatry

## 2017-12-04 ENCOUNTER — Telehealth: Payer: Self-pay | Admitting: *Deleted

## 2017-12-04 ENCOUNTER — Ambulatory Visit: Payer: BLUE CROSS/BLUE SHIELD | Admitting: Podiatry

## 2017-12-04 ENCOUNTER — Ambulatory Visit (INDEPENDENT_AMBULATORY_CARE_PROVIDER_SITE_OTHER): Payer: BLUE CROSS/BLUE SHIELD

## 2017-12-04 DIAGNOSIS — M2012 Hallux valgus (acquired), left foot: Secondary | ICD-10-CM | POA: Diagnosis not present

## 2017-12-04 DIAGNOSIS — M21611 Bunion of right foot: Secondary | ICD-10-CM

## 2017-12-04 DIAGNOSIS — M21612 Bunion of left foot: Secondary | ICD-10-CM

## 2017-12-04 DIAGNOSIS — M2011 Hallux valgus (acquired), right foot: Secondary | ICD-10-CM

## 2017-12-04 NOTE — Telephone Encounter (Signed)
"  I was there this morning and scheduled surgery.  I need to change that date.  I came to work and looked at my calendar and realized that date wouldn't work."  When would you like to reschedule it to?  "I'd like to do it on August 13 or 20."  He can do either date.  "Let's schedule it for August 20."  I'll make that change.    I am calling you back.  I need your husband's date of birth since you are on his insurance.  "It is 07/03/1973."

## 2017-12-04 NOTE — Progress Notes (Signed)
Subjective:   Patient ID: Sandra Carpenter, female   DOB: 42 y.o.   MRN: [id number]   HPI Patient presents stating she is had a painful bunion on her right foot is been going on a while and is gotten worse recently.  Patient states she is tried to change shoe gear and she is tried soaks without relief and patient does not smoke and likes to be active   Review of Systems  All other systems reviewed and are negative.       Objective:  Physical Exam  Constitutional: She appears well-developed and well-nourished.  Cardiovascular: Intact distal pulses.  Pulmonary/Chest: Effort normal.  Musculoskeletal: Normal range of motion.  Neurological: She is alert.  Skin: Skin is warm.  Nursing note and vitals reviewed.   Neurovascular status intact muscle strength adequate range of motion within normal limits with patient found to have exquisite discomfort medial side right first metatarsal with redness and pain and mild deviation of the right big toe.  Patient has good digital perfusion is well oriented x3 with mild deformity on the left that is not as severe as the right.     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  Condition reviewed with patient.  I reviewed different alternatives conservative and surgical and patient states is bothering her to the point that she wants to get it corrected after looking at the structure on x-ray.  I reviewed with her surgery and spent a great deal time going over with her alternative treatment surgery.  She wants to do consent form today due to her schedule and she needs to get this done as soon as possible so I allowed her to read consent form and reviewed all complications as outlined in the fact total recovery take 6 months to 1 year.  I did dispense air fracture walker with all instructions on usage and encouraged her to call with questions prior to her procedure  X-ray  indicates elevation of the intermetatarsal angle right over left foot

## 2017-12-04 NOTE — Patient Instructions (Addendum)
Bunion A bunion is a bump on the base of the big toe that forms when the bones of the big toe joint move out of position. Bunions may be small at first, but they often get larger over time. The can make walking painful. What are the causes? A bunion may be caused by:  Wearing narrow or pointed shoes that force the big toe to press against the other toes.  Abnormal foot development that causes the foot to roll inward (pronate).  Changes in the foot that are caused by certain diseases, such as rheumatoid arthritis and polio.  A foot injury.  What increases the risk? The following factors may make you more likely to develop this condition:  Wearing shoes that squeeze the toes together.  Having certain diseases, such as: ? Rheumatoid arthritis. ? Polio. ? Cerebral palsy.  Having family members who have bunions.  Being born with a foot deformity, such as flat feet or low arches.  Doing activities that put a lot of pressure on the feet, such as ballet dancing.  What are the signs or symptoms? The main symptom of a bunion is a noticeable bump on the big toe. Other symptoms may include:  Pain.  Swelling around the big toe.  Redness and inflammation.  Thick or hardened skin on the big toe or between the toes.  Stiffness or loss of motion in the big toe.  Trouble with walking.  How is this diagnosed? A bunion may be diagnosed based on your symptoms, medical history, and activities. You may have tests, such as:  X-rays. These allow your health care provider to check the position of the bones in your foot and look for damage to your joint. They also help your health care provider to determine the severity of your bunion and the best way to treat it.  Joint aspiration. In this test, a sample of fluid is removed from the toe joint. This test, which may be done if you are in a lot of pain, helps to rule out diseases that cause painful swelling of the joints, such as  arthritis.  How is this treated? There is no cure for a bunion, but treatment can help to prevent a bunion from getting worse. Treatment depends on the severity of your symptoms. Your health care provider may recommend:  Wearing shoes that have a wide toe box.  Using bunion pads to cushion the affected area.  Taping your toes together to keep them in a normal position.  Placing a device inside your shoe (orthotics) to help reduce pressure on your toe joint.  Taking medicine to ease pain, inflammation, and swelling.  Applying heat or ice to the affected area.  Doing stretching exercises.  Surgery to remove scar tissue and move the toes back into their normal position. This treatment is rare.  Follow these instructions at home:  Support your toe joint with proper footwear, shoe padding, or taping as told by your health care provider.  Take over-the-counter and prescription medicines only as told by your health care provider.  If directed, apply ice to the injured area: ? Put ice in a plastic bag. ? Place a towel between your skin and the bag. ? Leave the ice on for 20 minutes, 2-3 times per day.  If directed, apply heat to the affected area before you exercise. Use the heat source that your health care provider recommends, such as a moist heat pack or a heating pad. ? Place a towel between your   skin and the heat source. ? Leave the heat on for 20-30 minutes. ? Remove the heat if your skin turns bright red. This is especially important if you are unable to feel pain, heat, or cold. You may have a greater risk of getting burned.  Do exercises as told by your health care provider.  Keep all follow-up visits as told by your health care provider. Contact a health care provider if:  Your symptoms get worse.  Your symptoms do not improve in 2 weeks. Get help right away if:  You have severe pain and trouble with walking. This information is not intended to replace advice given  to you by your health care provider. Make sure you discuss any questions you have with your health care provider. Document Released: 05/02/2005 Document Revised: 10/08/2015 Document Reviewed: 11/30/2014 Elsevier Interactive Patient Education  2018 [employer]  Pre-Operative Instructions  Congratulations, you have decided to take an important step towards improving your quality of life.  You can be assured that the doctors and staff at [facility] will be with you every step of the way.  Here are some important things you should know:  1. Plan to be at the surgery center/hospital at least 1 (one) hour prior to your scheduled time, unless otherwise directed by the surgical center/hospital staff.  You must have a responsible adult accompany you, remain during the surgery and drive you home.  Make sure you have directions to the surgical center/hospital to ensure you arrive on time. 2. If you are having surgery at [facility] or [facility], you will need a copy of your medical history and physical form from your family physician within one month prior to the date of surgery. We will give you a form for your primary physician to complete.  3. We make every effort to accommodate the date you request for surgery.  However, there are times where surgery dates or times have to be moved.  We will contact you as soon as possible if a change in schedule is required.   4. No aspirin/ibuprofen for one week before surgery.  If you are on aspirin, any non-steroidal anti-inflammatory medications (Mobic, Aleve, Ibuprofen) should not be taken seven (7) days prior to your surgery.  You make take Tylenol for pain prior to surgery.  5. Medications - If you are taking daily heart and blood pressure medications, seizure, reflux, allergy, asthma, anxiety, pain or diabetes medications, make sure you notify the surgery center/hospital before the day of surgery so they can tell you which medications you should  take or avoid the day of surgery. 6. No food or drink after midnight the night before surgery unless directed otherwise by surgical center/hospital staff. 7. No alcoholic beverages 24-hours prior to surgery.  No smoking 24-hours prior or 24-hours after surgery. 8. Wear loose pants or shorts. They should be loose enough to fit over bandages, boots, and casts. 9. Don't wear slip-on shoes. Sneakers are preferred. 10. Bring your boot with you to the surgery center/hospital.  Also bring crutches or a walker if your physician has prescribed it for you.  If you do not have this equipment, it will be provided for you after surgery. 11. If you have not been contacted by the surgery center/hospital by the day before your surgery, call to confirm the date and time of your surgery. 12. Leave-time from work may vary depending on the type of surgery you have.  Appropriate arrangements should be made prior   to surgery with your employer. 13. Prescriptions will be provided immediately following surgery by your doctor.  Fill these as soon as possible after surgery and take the medication as directed. Pain medications will not be refilled on weekends and must be approved by the doctor. 14. Remove nail polish on the operative foot and avoid getting pedicures prior to surgery. 15. Wash the night before surgery.  The night before surgery wash the foot and leg well with water and the antibacterial soap provided. Be sure to pay special attention to beneath the toenails and in between the toes.  Wash for at least three (3) minutes. Rinse thoroughly with water and dry well with a towel.  Perform this wash unless told not to do so by your physician.  Enclosed: 1 Ice pack (please put in freezer the night before surgery)   1 Hibiclens skin cleaner   Pre-op instructions  If you have any questions regarding the instructions, please do not hesitate to call our office.  [place]: [address], [place], [place] [address] --  [phone]  [place]: [address] [place], [place] [address] -- [phone]  [place]: [address]  [place], [place] [address] -- [phone]  [place]: [address], [address], [place],  [address] -- [phone]  Website: [email or web address]  

## 2018-01-01 ENCOUNTER — Telehealth: Payer: Self-pay | Admitting: *Deleted

## 2018-01-01 NOTE — Telephone Encounter (Addendum)
"  I am scheduled for surgery on tomorrow.  I have a few questions.  Will I need crutches or anything?  How long does it take for my foot to heel?  How long will I be wearing the boot?  What will I wear after I come out of the boot?  Can I get a manicure before my surgery?"  You cannot get a manicure.  "Why what can that do?"  Your foot can get infected.  Toenail polish will have to be removed.  "So you are telling me I have to go for my surgery with my foot looking like this, that's not good!"  You will be wearing the boot a couple of weeks.  Then you will transition into a surgical shoe.  After the surgical shoe, you will be able to wear a sneaker.  It takes about 8-12 weeks for actual bone healing.  "Thanks for answering my questions."

## 2018-01-02 ENCOUNTER — Encounter: Payer: Self-pay | Admitting: Podiatry

## 2018-01-02 ENCOUNTER — Telehealth: Payer: Self-pay | Admitting: Podiatry

## 2018-01-02 DIAGNOSIS — M21611 Bunion of right foot: Secondary | ICD-10-CM | POA: Diagnosis not present

## 2018-01-02 DIAGNOSIS — I10 Essential (primary) hypertension: Secondary | ICD-10-CM | POA: Diagnosis not present

## 2018-01-02 DIAGNOSIS — M2011 Hallux valgus (acquired), right foot: Secondary | ICD-10-CM | POA: Diagnosis not present

## 2018-01-02 NOTE — Telephone Encounter (Signed)
I spoke with [facility] and she states the percocet 5/325 has been marked out and 10/325 written in. Sapana asked if the prescription been tapered with. I told Sapana,the percocet rx was handwritten today at the surgery, I would need to contact Dr. Charlsie Merlesegal for the proper prescription information.

## 2018-01-02 NOTE — Telephone Encounter (Signed)
I spoke with Everardo [facility] and asked her to get the percocet prescription information from Dr. Charlsie Merlesegal. Everardo [facility] - [facility] called again and told me Dr. Charlsie Merlesegal had changed the prescription to 10/325 percocet and initialed the change.

## 2018-01-02 NOTE — Telephone Encounter (Signed)
I informed [facility] that Dr. Charlsie Merlesegal did make the change to percocet 10/325 and initialed it. Sapana states she will fill.

## 2018-01-02 NOTE — Telephone Encounter (Signed)
This is [facility] calling in regards to a prescription written by Dr. Charlsie Merlesegal today. The prescription is written for percocet, it is spelled wrong and also the strand been canceled, the correct is 3-325. I wanted to make sure we can change this to the brand name. So give us a call back at your earliest convenience at [phone]. Thank you.

## 2018-01-02 NOTE — Telephone Encounter (Signed)
[facility], for [facility] information, and the call was dropped twice.

## 2018-01-10 ENCOUNTER — Ambulatory Visit (INDEPENDENT_AMBULATORY_CARE_PROVIDER_SITE_OTHER): Payer: BLUE CROSS/BLUE SHIELD | Admitting: Podiatry

## 2018-01-10 ENCOUNTER — Encounter: Payer: Self-pay | Admitting: Podiatry

## 2018-01-10 ENCOUNTER — Ambulatory Visit (INDEPENDENT_AMBULATORY_CARE_PROVIDER_SITE_OTHER): Payer: BLUE CROSS/BLUE SHIELD

## 2018-01-10 VITALS — BP 103/67 | Temp 98.7°F

## 2018-01-10 DIAGNOSIS — M21611 Bunion of right foot: Secondary | ICD-10-CM | POA: Diagnosis not present

## 2018-01-10 DIAGNOSIS — M21612 Bunion of left foot: Secondary | ICD-10-CM

## 2018-01-10 NOTE — Progress Notes (Signed)
Subjective:   Patient ID: Sandra Carpenter, female   DOB: 42 y.o.   MRN: [id number]   HPI Patient presents stating that she is doing well overall but it does hurt at times and at this time she is only taking Tylenol for pain.  8 days after foot surgery right   ROS      Objective:  Physical Exam  Neurovascular status intact with patient having negative Homans sign and noted to have well-healing surgical site right first metatarsal wound edges well coapted hallux in rectus position     Assessment:  Doing well post osteotomy first metatarsal right with good alignment noted wound edges well coapted and reasonable range of motion     Plan:  H&P x-ray reviewed and today advised on stretching exercises for the first MPJ compression therapy elevation and continued immobilization.  Reappoint to recheck 4 weeks or earlier if needed  X-ray indicates osteotomies healing well fixation in place joint congruence

## 2018-01-11 DIAGNOSIS — B370 Candidal stomatitis: Secondary | ICD-10-CM | POA: Diagnosis not present

## 2018-01-11 DIAGNOSIS — J0140 Acute pansinusitis, unspecified: Secondary | ICD-10-CM | POA: Diagnosis not present

## 2018-01-24 ENCOUNTER — Ambulatory Visit (INDEPENDENT_AMBULATORY_CARE_PROVIDER_SITE_OTHER): Payer: BLUE CROSS/BLUE SHIELD | Admitting: Podiatry

## 2018-01-24 ENCOUNTER — Ambulatory Visit (INDEPENDENT_AMBULATORY_CARE_PROVIDER_SITE_OTHER): Payer: BLUE CROSS/BLUE SHIELD

## 2018-01-24 ENCOUNTER — Encounter: Payer: Self-pay | Admitting: Podiatry

## 2018-01-24 DIAGNOSIS — M21612 Bunion of left foot: Principal | ICD-10-CM

## 2018-01-24 DIAGNOSIS — M21611 Bunion of right foot: Secondary | ICD-10-CM

## 2018-01-24 NOTE — Progress Notes (Signed)
Subjective:   Patient ID: Sandra Carpenter, female   DOB: 42 y.o.   MRN: [id number]   HPI Patient states overall doing well with my right foot with mild discomfort but I am satisfied with where things are and I am walking with a good gait pattern   ROS      Objective:  Physical Exam  Neurovascular status intact with patient's right foot healing well wound edges well coapted hallux in rectus position range of motion adequate with no crepitus and negative Homans sign noted      Assessment:  Doing well post osteotomy first metatarsal right with good healing occurring     Plan:  Reviewed x-rays advised on compression therapy continued elevation and gradual increase in ambulation.  Reappoint to recheck and also advised on physical therapy to help with increased range of motion first MPJ  X-ray indicates osteotomies healing well with fixation in place

## 2018-01-29 ENCOUNTER — Telehealth: Payer: Self-pay | Admitting: *Deleted

## 2018-01-29 DIAGNOSIS — M21612 Bunion of left foot: Principal | ICD-10-CM

## 2018-01-29 DIAGNOSIS — M21611 Bunion of right foot: Secondary | ICD-10-CM

## 2018-01-29 DIAGNOSIS — Z9889 Other specified postprocedural states: Secondary | ICD-10-CM

## 2018-01-29 NOTE — Telephone Encounter (Deleted)
[facility]

## 2018-01-29 NOTE — Telephone Encounter (Signed)
Hand-delivered [employer] form.

## 2018-01-29 NOTE — Addendum Note (Signed)
Addended by: Alphia Kava'CONNELL, Devine Dant D on: 01/29/2018 04:23 PM   Modules accepted: Orders

## 2018-02-08 DIAGNOSIS — I10 Essential (primary) hypertension: Secondary | ICD-10-CM | POA: Diagnosis not present

## 2018-02-08 DIAGNOSIS — R7303 Prediabetes: Secondary | ICD-10-CM | POA: Diagnosis not present

## 2018-02-08 DIAGNOSIS — D509 Iron deficiency anemia, unspecified: Secondary | ICD-10-CM | POA: Diagnosis not present

## 2018-02-08 DIAGNOSIS — Z23 Encounter for immunization: Secondary | ICD-10-CM | POA: Diagnosis not present

## 2018-02-09 DIAGNOSIS — M25674 Stiffness of right foot, not elsewhere classified: Secondary | ICD-10-CM | POA: Diagnosis not present

## 2018-02-09 DIAGNOSIS — M25474 Effusion, right foot: Secondary | ICD-10-CM | POA: Diagnosis not present

## 2018-02-09 DIAGNOSIS — R269 Unspecified abnormalities of gait and mobility: Secondary | ICD-10-CM | POA: Diagnosis not present

## 2018-02-09 DIAGNOSIS — M25571 Pain in right ankle and joints of right foot: Secondary | ICD-10-CM | POA: Diagnosis not present

## 2018-02-12 DIAGNOSIS — M25674 Stiffness of right foot, not elsewhere classified: Secondary | ICD-10-CM | POA: Diagnosis not present

## 2018-02-12 DIAGNOSIS — M25571 Pain in right ankle and joints of right foot: Secondary | ICD-10-CM | POA: Diagnosis not present

## 2018-02-12 DIAGNOSIS — R269 Unspecified abnormalities of gait and mobility: Secondary | ICD-10-CM | POA: Diagnosis not present

## 2018-02-12 DIAGNOSIS — M25474 Effusion, right foot: Secondary | ICD-10-CM | POA: Diagnosis not present

## 2018-02-14 DIAGNOSIS — R269 Unspecified abnormalities of gait and mobility: Secondary | ICD-10-CM | POA: Diagnosis not present

## 2018-02-14 DIAGNOSIS — M25571 Pain in right ankle and joints of right foot: Secondary | ICD-10-CM | POA: Diagnosis not present

## 2018-02-14 DIAGNOSIS — M25474 Effusion, right foot: Secondary | ICD-10-CM | POA: Diagnosis not present

## 2018-02-14 DIAGNOSIS — M25674 Stiffness of right foot, not elsewhere classified: Secondary | ICD-10-CM | POA: Diagnosis not present

## 2018-02-19 DIAGNOSIS — R269 Unspecified abnormalities of gait and mobility: Secondary | ICD-10-CM | POA: Diagnosis not present

## 2018-02-19 DIAGNOSIS — M25674 Stiffness of right foot, not elsewhere classified: Secondary | ICD-10-CM | POA: Diagnosis not present

## 2018-02-19 DIAGNOSIS — M25571 Pain in right ankle and joints of right foot: Secondary | ICD-10-CM | POA: Diagnosis not present

## 2018-02-19 DIAGNOSIS — M25474 Effusion, right foot: Secondary | ICD-10-CM | POA: Diagnosis not present

## 2018-02-21 DIAGNOSIS — R269 Unspecified abnormalities of gait and mobility: Secondary | ICD-10-CM | POA: Diagnosis not present

## 2018-02-21 DIAGNOSIS — M25674 Stiffness of right foot, not elsewhere classified: Secondary | ICD-10-CM | POA: Diagnosis not present

## 2018-02-21 DIAGNOSIS — M25474 Effusion, right foot: Secondary | ICD-10-CM | POA: Diagnosis not present

## 2018-02-21 DIAGNOSIS — M25571 Pain in right ankle and joints of right foot: Secondary | ICD-10-CM | POA: Diagnosis not present

## 2018-02-26 DIAGNOSIS — R269 Unspecified abnormalities of gait and mobility: Secondary | ICD-10-CM | POA: Diagnosis not present

## 2018-02-26 DIAGNOSIS — M25674 Stiffness of right foot, not elsewhere classified: Secondary | ICD-10-CM | POA: Diagnosis not present

## 2018-02-26 DIAGNOSIS — M25474 Effusion, right foot: Secondary | ICD-10-CM | POA: Diagnosis not present

## 2018-02-26 DIAGNOSIS — M25571 Pain in right ankle and joints of right foot: Secondary | ICD-10-CM | POA: Diagnosis not present

## 2018-02-28 ENCOUNTER — Other Ambulatory Visit: Payer: BLUE CROSS/BLUE SHIELD | Admitting: Podiatry

## 2018-03-02 DIAGNOSIS — M25474 Effusion, right foot: Secondary | ICD-10-CM | POA: Diagnosis not present

## 2018-03-02 DIAGNOSIS — R269 Unspecified abnormalities of gait and mobility: Secondary | ICD-10-CM | POA: Diagnosis not present

## 2018-03-02 DIAGNOSIS — M25571 Pain in right ankle and joints of right foot: Secondary | ICD-10-CM | POA: Diagnosis not present

## 2018-03-02 DIAGNOSIS — M25674 Stiffness of right foot, not elsewhere classified: Secondary | ICD-10-CM | POA: Diagnosis not present

## 2018-03-05 DIAGNOSIS — R269 Unspecified abnormalities of gait and mobility: Secondary | ICD-10-CM | POA: Diagnosis not present

## 2018-03-05 DIAGNOSIS — M25571 Pain in right ankle and joints of right foot: Secondary | ICD-10-CM | POA: Diagnosis not present

## 2018-03-05 DIAGNOSIS — M25674 Stiffness of right foot, not elsewhere classified: Secondary | ICD-10-CM | POA: Diagnosis not present

## 2018-03-05 DIAGNOSIS — M25474 Effusion, right foot: Secondary | ICD-10-CM | POA: Diagnosis not present

## 2018-03-07 ENCOUNTER — Ambulatory Visit (INDEPENDENT_AMBULATORY_CARE_PROVIDER_SITE_OTHER): Payer: BLUE CROSS/BLUE SHIELD

## 2018-03-07 ENCOUNTER — Encounter: Payer: Self-pay | Admitting: Podiatry

## 2018-03-07 ENCOUNTER — Ambulatory Visit (INDEPENDENT_AMBULATORY_CARE_PROVIDER_SITE_OTHER): Payer: BLUE CROSS/BLUE SHIELD | Admitting: Podiatry

## 2018-03-07 DIAGNOSIS — Z862 Personal history of diseases of the blood and blood-forming organs and certain disorders involving the immune mechanism: Secondary | ICD-10-CM | POA: Diagnosis not present

## 2018-03-07 DIAGNOSIS — I10 Essential (primary) hypertension: Secondary | ICD-10-CM | POA: Diagnosis not present

## 2018-03-07 DIAGNOSIS — M2011 Hallux valgus (acquired), right foot: Secondary | ICD-10-CM | POA: Diagnosis not present

## 2018-03-07 DIAGNOSIS — N183 Chronic kidney disease, stage 3 (moderate): Secondary | ICD-10-CM | POA: Diagnosis not present

## 2018-03-07 DIAGNOSIS — Z9889 Other specified postprocedural states: Secondary | ICD-10-CM

## 2018-03-08 NOTE — Progress Notes (Signed)
Subjective:   Patient ID: Sandra Carpenter, female   DOB: 42 y.o.   MRN: [id number]   HPI Patient states the physical therapy seems to be bothering her a lot in the right foot and she is walking with a better heel toe like gait and is very satisfied with where she is added this.  Postop   ROS      Objective:  Physical Exam  Neurovascular status intact with patient's right foot healing well with wound edges well coapted hallux in rectus position with good range of motion no crepitus     Assessment:  Doing well post osteotomy of the right first metatarsal with physical therapy significantly improving range of motion     Plan:  H&P condition reviewed and at this point recommended continued range of motion exercises and heel toe like gait pattern.  Patient will be seen back for [place] to recheck  X-rays indicate the osteotomy is healing well with good alignment fixation in place joint congruous

## 2018-03-15 ENCOUNTER — Other Ambulatory Visit (HOSPITAL_COMMUNITY): Payer: Self-pay

## 2018-03-16 ENCOUNTER — Encounter (HOSPITAL_COMMUNITY)
Admission: RE | Admit: 2018-03-16 | Discharge: 2018-03-16 | Disposition: A | Discharge status: Home / Self Care | Payer: BLUE CROSS/BLUE SHIELD | Source: Ambulatory Visit | Attending: Nephrology | Admitting: Nephrology

## 2018-03-16 DIAGNOSIS — N189 Chronic kidney disease, unspecified: Secondary | ICD-10-CM | POA: Insufficient documentation

## 2018-03-16 DIAGNOSIS — D631 Anemia in chronic kidney disease: Secondary | ICD-10-CM | POA: Insufficient documentation

## 2018-03-16 MED ORDER — SODIUM CHLORIDE 0.9 % IV SOLN
510.0000 mg | INTRAVENOUS | Status: DC
  Administered 2018-03-16: 510 mg via INTRAVENOUS
  Filled 2018-03-16: qty 17

## 2018-03-16 NOTE — Discharge Instructions (Signed)

## 2018-03-22 ENCOUNTER — Other Ambulatory Visit (HOSPITAL_COMMUNITY): Payer: Self-pay | Admitting: *Deleted

## 2018-03-23 ENCOUNTER — Ambulatory Visit (HOSPITAL_COMMUNITY)
Admission: RE | Admit: 2018-03-23 | Discharge: 2018-03-23 | Disposition: A | Discharge status: Home / Self Care | Payer: BLUE CROSS/BLUE SHIELD | Source: Ambulatory Visit | Attending: Nephrology | Admitting: Nephrology

## 2018-03-23 DIAGNOSIS — N189 Chronic kidney disease, unspecified: Secondary | ICD-10-CM | POA: Insufficient documentation

## 2018-03-23 DIAGNOSIS — D631 Anemia in chronic kidney disease: Secondary | ICD-10-CM | POA: Diagnosis not present

## 2018-03-23 MED ORDER — SODIUM CHLORIDE 0.9 % IV SOLN
510.0000 mg | INTRAVENOUS | Status: DC
  Administered 2018-03-23: 510 mg via INTRAVENOUS
  Filled 2018-03-23: qty 17

## 2018-05-04 DIAGNOSIS — D509 Iron deficiency anemia, unspecified: Secondary | ICD-10-CM | POA: Diagnosis not present

## 2018-07-06 DIAGNOSIS — B370 Candidal stomatitis: Secondary | ICD-10-CM | POA: Diagnosis not present

## 2018-07-06 DIAGNOSIS — J329 Chronic sinusitis, unspecified: Secondary | ICD-10-CM | POA: Diagnosis not present

## 2018-07-30 DIAGNOSIS — B370 Candidal stomatitis: Secondary | ICD-10-CM | POA: Diagnosis not present

## 2018-07-30 DIAGNOSIS — J342 Deviated nasal septum: Secondary | ICD-10-CM | POA: Diagnosis not present

## 2018-07-30 DIAGNOSIS — J329 Chronic sinusitis, unspecified: Secondary | ICD-10-CM | POA: Diagnosis not present

## 2018-07-30 DIAGNOSIS — J343 Hypertrophy of nasal turbinates: Secondary | ICD-10-CM | POA: Diagnosis not present

## 2018-09-19 DIAGNOSIS — R7303 Prediabetes: Secondary | ICD-10-CM | POA: Diagnosis not present

## 2018-09-19 DIAGNOSIS — D509 Iron deficiency anemia, unspecified: Secondary | ICD-10-CM | POA: Diagnosis not present

## 2018-09-19 DIAGNOSIS — I10 Essential (primary) hypertension: Secondary | ICD-10-CM | POA: Diagnosis not present

## 2018-10-02 DIAGNOSIS — E611 Iron deficiency: Secondary | ICD-10-CM | POA: Diagnosis not present

## 2018-10-02 DIAGNOSIS — R7303 Prediabetes: Secondary | ICD-10-CM | POA: Diagnosis not present

## 2018-10-02 DIAGNOSIS — I10 Essential (primary) hypertension: Secondary | ICD-10-CM | POA: Diagnosis not present

## 2018-10-02 DIAGNOSIS — J329 Chronic sinusitis, unspecified: Secondary | ICD-10-CM | POA: Diagnosis not present

## 2018-10-26 ENCOUNTER — Encounter (HOSPITAL_COMMUNITY): Payer: Self-pay

## 2018-10-26 ENCOUNTER — Other Ambulatory Visit: Payer: Self-pay

## 2018-10-26 ENCOUNTER — Ambulatory Visit (HOSPITAL_COMMUNITY)
Admission: EM | Admit: 2018-10-26 | Discharge: 2018-10-26 | Disposition: A | Discharge status: Home / Self Care | Payer: BC Managed Care – PPO | Attending: Family Medicine | Admitting: Family Medicine

## 2018-10-26 ENCOUNTER — Ambulatory Visit (INDEPENDENT_AMBULATORY_CARE_PROVIDER_SITE_OTHER): Payer: BC Managed Care – PPO

## 2018-10-26 DIAGNOSIS — R1032 Left lower quadrant pain: Secondary | ICD-10-CM | POA: Insufficient documentation

## 2018-10-26 DIAGNOSIS — K5909 Other constipation: Secondary | ICD-10-CM | POA: Insufficient documentation

## 2018-10-26 LAB — CBC
HCT: 39.1 % (ref 36.0–46.0)
Hemoglobin: 12.8 g/dL (ref 12.0–15.0)
MCH: 27.2 pg (ref 26.0–34.0)
MCHC: 32.7 g/dL (ref 30.0–36.0)
MCV: 83 fL (ref 80.0–100.0)
Platelets: 333 10*3/uL (ref 150–400)
RBC: 4.71 MIL/uL (ref 3.87–5.11)
RDW: 14.7 % (ref 11.5–15.5)
WBC: 9 10*3/uL (ref 4.0–10.5)
nRBC: 0 % (ref 0.0–0.2)

## 2018-10-26 LAB — POCT URINALYSIS DIP (DEVICE)
Bilirubin Urine: NEGATIVE
Glucose, UA: NEGATIVE mg/dL
Ketones, ur: NEGATIVE mg/dL
Leukocytes,Ua: NEGATIVE
Nitrite: NEGATIVE
Protein, ur: 100 mg/dL — AB
Specific Gravity, Urine: 1.025 (ref 1.005–1.030)
Urobilinogen, UA: 0.2 mg/dL (ref 0.0–1.0)
pH: 6 (ref 5.0–8.0)

## 2018-10-26 NOTE — ED Triage Notes (Signed)
Patient presents to Urgent Care with complaints of right lower quadrant abdominal pain since this morning. Patient reports the pain has gotten better since it started, has a hx of "slow colon" and took a double dose of Linzess this morning and thinks maybe that is why she is feeling better. Pt has not had a BM this morning, daily BMs are not her usual.

## 2018-10-26 NOTE — ED Provider Notes (Signed)
[facility]    CSN: [id number] Arrival date & time: 10/26/18  1330      History   Chief Complaint Chief Complaint  Patient presents with  . Appointment    1:30  . Abdominal Pain    HPI Sandra Carpenter is a 43 y.o. female.   HPI  Patient is here for abdominal pain of 1 day duration.  She awoke this morning around 4:00 in the morning with left lower quadrant crampy pain.  She states it was severe.  Unremitting.  Lasted for hours.  Slowly improving as the day goes on.  She has not eaten yet today.  She is drinking fluids.  Her last bowel movement was the day before yesterday.  She has chronic slow transit constipation and is on Linzess.  She took an extra Linzess this morning.  No fever no chills.  She did have a colonoscopy a couple years ago that was normal.  She is uncertain if she was told whether there was diverticula.  No blood in bowels.  Weight is stable.  No new foods.  Past Medical History:  Diagnosis Date  . Chronic kidney disease   . Hypertension   . Pyelonephritis     Patient Active Problem List   Diagnosis Date Noted  . Microcytic anemia 12/27/2016  . Pyelonephritis     Past Surgical History:  Procedure Laterality Date  . CESAREAN SECTION    . TUBAL LIGATION      OB History   No obstetric history on file.      Home Medications    Prior to Admission medications   Medication Sig Start Date End Date Taking? Authorizing Provider  amLODipine (NORVASC) 5 MG tablet Take 5 mg by mouth daily.   Yes [provider]  Fe Fum-FePoly-Vit C-Vit B3 (INTEGRA) 62.5-62.5-40-3 MG CAPS Take by mouth.   Yes [provider]  linaclotide (LINZESS) 145 MCG CAPS capsule Take 145 mcg by mouth daily before breakfast.   Yes [provider]  albuterol (PROVENTIL) (5 MG/ML) 0.5% nebulizer solution Take 0.5 mLs (2.5 mg total) by nebulization every 4 (four) hours as needed for wheezing or shortness of breath. 07/12/17   Zadie RhineWickline, Donald, MD   Biotin 1 MG CAPS Take by mouth.    [provider]  ferrous sulfate 325 (65 FE) MG EC tablet Take 1 tablet (325 mg total) by mouth 3 (three) times daily with meals. 01/25/17 02/24/17  Daisy BlossomPerlov, Mikhail G, MD  hydrochlorothiazide (HYDRODIURIL) 25 MG tablet hydrochlorothiazide 25 mg tabs    [provider]  ibuprofen (ADVIL,MOTRIN) 800 MG tablet ibuprofen 800 mg tablet  TAKE 1 TABLET EVERY 8 HOURS BY MOUTH    [provider]  loratadine (CLARITIN) 10 MG tablet Take by mouth.    [provider]  valACYclovir (VALTREX) 500 MG tablet valacyclovir 500 mg tablet  TAKE 1 TABLET BY MOUTH EVERY DAY    [provider]  fluticasone (FLONASE) 50 MCG/ACT nasal spray fluticasone 50 mcg/actuation nasal spray,suspension  2 SPRAYS IN EACH NOSTRIL ONCE A DAY IF NEEDED  10/26/18  [provider]  promethazine (PHENERGAN) 25 MG tablet promethazine 25 mg tablet  Take 1 tablet every 4 hours by oral route.  10/26/18  [provider]    Family History Family History  Problem Relation Age of Onset  . Heart failure Mother   . Hypertension Mother   . Hypertension Father     Social History Social History   Tobacco Use  .  Smoking status: Never Smoker  . Smokeless tobacco: Never Used  Substance Use Topics  . Alcohol use: Yes    Comment: occassional   . Drug use: No     Allergies   Sulfa antibiotics and Penicillins   Review of Systems Review of Systems  Constitutional: Negative for chills and fever.  HENT: Negative for ear pain and sore throat.   Eyes: Negative for pain and visual disturbance.  Respiratory: Negative for cough and shortness of breath.   Cardiovascular: Negative for chest pain and palpitations.  Gastrointestinal: Positive for abdominal pain and constipation. Negative for diarrhea, nausea and vomiting.  Genitourinary: Negative for dysuria and hematuria.  Musculoskeletal: Negative for arthralgias and back pain.  Skin: Negative  for color change and rash.  Neurological: Negative for seizures and syncope.  All other systems reviewed and are negative.    Physical Exam Triage Vital Signs ED Triage Vitals  Enc Vitals Group     BP 10/26/18 1402 137/89     Pulse Rate 10/26/18 1402 85     Resp 10/26/18 1402 16     Temp 10/26/18 1402 98.3 F (36.8 C)     Temp Source 10/26/18 1402 Oral     SpO2 10/26/18 1402 100 %     Weight --      Height --      Head Circumference --      Peak Flow --      Pain Score 10/26/18 1354 4     Pain Loc --      Pain Edu? --      Excl. in GC? --    No data found.  Updated Vital Signs BP 137/89 (BP Location: Right Arm)   Pulse 85   Temp 98.3 F (36.8 C) (Oral)   Resp 16   SpO2 100%     Physical Exam Constitutional:      General: She is not in acute distress.    Appearance: She is well-developed.  HENT:     Head: Normocephalic and atraumatic.  Eyes:     Conjunctiva/sclera: Conjunctivae normal.     Pupils: Pupils are equal, round, and reactive to light.  Neck:     Musculoskeletal: Normal range of motion.  Cardiovascular:     Rate and Rhythm: Normal rate and regular rhythm.     Heart sounds: Normal heart sounds.  Pulmonary:     Effort: Pulmonary effort is normal. No respiratory distress.     Breath sounds: Normal breath sounds.  Abdominal:     General: Abdomen is flat. Bowel sounds are normal. There is no distension.     Palpations: Abdomen is soft. There is no hepatomegaly or splenomegaly.     Tenderness: There is abdominal tenderness in the left lower quadrant. There is no right CVA tenderness, left CVA tenderness, guarding or rebound.     Comments: Mild tenderness to deep palpation in the left lower quadrant with no palpable mass.  No guarding or rebound  Musculoskeletal: Normal range of motion.  Skin:    General: Skin is warm and dry.  Neurological:     Mental Status: She is alert.      UC Treatments / Results  Labs (all labs ordered are listed, but only  abnormal results are displayed) Labs Reviewed  POCT URINALYSIS DIP (DEVICE) - Abnormal; Notable for the following components:      Result Value   Hgb urine dipstick TRACE (*)    Protein, ur 100 (*)    All  other components within normal limits  CBC    EKG None  Radiology Dg Abd 1 View  Result Date: 10/26/2018 CLINICAL DATA:  Left lower quadrant abdominal pain for 1 day. EXAM: ABDOMEN - 1 VIEW COMPARISON:  CT abdomen and pelvis 11/13/2012 FINDINGS: Gas is present in nondilated loops of small and large bowel without evidence of obstruction. No gross intraperitoneal free air is identified on this supine study. Small calcifications in the pelvis likely represent phleboliths. No acute osseous abnormality is seen. IMPRESSION: Negative. Electronically Signed   By: Logan Bores M.D.   On: 10/26/2018 15:06    Procedures Procedures (including critical care time)  Medications Ordered in UC Medications - No data to display  Initial Impression / Assessment and Plan / UC Course  I have reviewed the triage vital signs and the nursing notes.  Pertinent labs & imaging results that were available during my care of the patient were reviewed by me and considered in my medical decision making (see chart for details).  Clinical Course as of Oct 25 1552  Fri Oct 26, 2018  1522 CBC [YN]    Clinical Course User Index [YN] Raylene Everts, MD    Explained to the patient that the CBC, urinalysis, and x-ray are all normal.  This is reassuring.  She can be treated at home.  Is to come back to the ER promptly if she has worsening pain, fever, vomiting Final Clinical Impressions(s) / UC Diagnoses   Final diagnoses:  Left lower quadrant abdominal pain  Constipation, chronic     Discharge Instructions     You have no evidence of infection The x ray is normal - no constipation Consider adding daily metamucil or miralax to your regimen Stool softeners are safe to take daily and help with bowels  and digestion Call if worse   ED Prescriptions    None     Controlled Substance Prescriptions [place] Controlled Substance Registry consulted? No   Raylene Everts, MD 10/26/18 1556

## 2018-10-26 NOTE — Discharge Instructions (Addendum)
You have no evidence of infection The x ray is normal - no constipation Consider adding daily metamucil or miralax to your regimen Stool softeners are safe to take daily and help with bowels and digestion Call if worse

## 2019-03-08 DIAGNOSIS — R635 Abnormal weight gain: Secondary | ICD-10-CM | POA: Diagnosis not present

## 2019-03-08 DIAGNOSIS — E559 Vitamin D deficiency, unspecified: Secondary | ICD-10-CM | POA: Diagnosis not present

## 2019-03-08 DIAGNOSIS — R7303 Prediabetes: Secondary | ICD-10-CM | POA: Diagnosis not present

## 2019-03-08 DIAGNOSIS — N951 Menopausal and female climacteric states: Secondary | ICD-10-CM | POA: Diagnosis not present

## 2019-03-08 DIAGNOSIS — R7989 Other specified abnormal findings of blood chemistry: Secondary | ICD-10-CM | POA: Diagnosis not present

## 2019-03-12 DIAGNOSIS — E559 Vitamin D deficiency, unspecified: Secondary | ICD-10-CM | POA: Diagnosis not present

## 2019-03-12 DIAGNOSIS — Z6831 Body mass index (BMI) 31.0-31.9, adult: Secondary | ICD-10-CM | POA: Diagnosis not present

## 2019-03-12 DIAGNOSIS — R232 Flushing: Secondary | ICD-10-CM | POA: Diagnosis not present

## 2019-03-12 DIAGNOSIS — Z1339 Encounter for screening examination for other mental health and behavioral disorders: Secondary | ICD-10-CM | POA: Diagnosis not present

## 2019-03-12 DIAGNOSIS — Z1331 Encounter for screening for depression: Secondary | ICD-10-CM | POA: Diagnosis not present

## 2019-03-12 DIAGNOSIS — R7303 Prediabetes: Secondary | ICD-10-CM | POA: Diagnosis not present

## 2019-03-19 DIAGNOSIS — Z6831 Body mass index (BMI) 31.0-31.9, adult: Secondary | ICD-10-CM | POA: Diagnosis not present

## 2019-03-19 DIAGNOSIS — R7303 Prediabetes: Secondary | ICD-10-CM | POA: Diagnosis not present

## 2019-04-04 DIAGNOSIS — Z6831 Body mass index (BMI) 31.0-31.9, adult: Secondary | ICD-10-CM | POA: Diagnosis not present

## 2019-04-04 DIAGNOSIS — E559 Vitamin D deficiency, unspecified: Secondary | ICD-10-CM | POA: Diagnosis not present

## 2019-04-09 DIAGNOSIS — I10 Essential (primary) hypertension: Secondary | ICD-10-CM | POA: Diagnosis not present

## 2019-04-09 DIAGNOSIS — K5909 Other constipation: Secondary | ICD-10-CM | POA: Diagnosis not present

## 2019-04-09 DIAGNOSIS — R7303 Prediabetes: Secondary | ICD-10-CM | POA: Diagnosis not present

## 2019-04-10 DIAGNOSIS — R7303 Prediabetes: Secondary | ICD-10-CM | POA: Diagnosis not present

## 2019-04-10 DIAGNOSIS — I10 Essential (primary) hypertension: Secondary | ICD-10-CM | POA: Diagnosis not present

## 2019-04-10 DIAGNOSIS — E559 Vitamin D deficiency, unspecified: Secondary | ICD-10-CM | POA: Diagnosis not present

## 2019-04-10 DIAGNOSIS — Z6830 Body mass index (BMI) 30.0-30.9, adult: Secondary | ICD-10-CM | POA: Diagnosis not present

## 2019-04-20 DIAGNOSIS — R519 Headache, unspecified: Secondary | ICD-10-CM | POA: Diagnosis not present

## 2019-04-20 DIAGNOSIS — Z20828 Contact with and (suspected) exposure to other viral communicable diseases: Secondary | ICD-10-CM | POA: Diagnosis not present

## 2019-04-20 DIAGNOSIS — J3489 Other specified disorders of nose and nasal sinuses: Secondary | ICD-10-CM | POA: Diagnosis not present

## 2019-04-23 DIAGNOSIS — Z6830 Body mass index (BMI) 30.0-30.9, adult: Secondary | ICD-10-CM | POA: Diagnosis not present

## 2019-04-23 DIAGNOSIS — E611 Iron deficiency: Secondary | ICD-10-CM | POA: Diagnosis not present

## 2019-06-04 DIAGNOSIS — R7303 Prediabetes: Secondary | ICD-10-CM | POA: Diagnosis not present

## 2019-06-04 DIAGNOSIS — Z6830 Body mass index (BMI) 30.0-30.9, adult: Secondary | ICD-10-CM | POA: Diagnosis not present

## 2019-06-04 DIAGNOSIS — E559 Vitamin D deficiency, unspecified: Secondary | ICD-10-CM | POA: Diagnosis not present

## 2019-06-12 DIAGNOSIS — Z124 Encounter for screening for malignant neoplasm of cervix: Secondary | ICD-10-CM | POA: Diagnosis not present

## 2019-06-12 DIAGNOSIS — Z6831 Body mass index (BMI) 31.0-31.9, adult: Secondary | ICD-10-CM | POA: Diagnosis not present

## 2019-06-12 DIAGNOSIS — Z01411 Encounter for gynecological examination (general) (routine) with abnormal findings: Secondary | ICD-10-CM | POA: Diagnosis not present

## 2019-06-12 DIAGNOSIS — Z1231 Encounter for screening mammogram for malignant neoplasm of breast: Secondary | ICD-10-CM | POA: Diagnosis not present

## 2019-06-12 DIAGNOSIS — N183 Chronic kidney disease, stage 3 unspecified: Secondary | ICD-10-CM | POA: Diagnosis not present

## 2019-06-12 DIAGNOSIS — N898 Other specified noninflammatory disorders of vagina: Secondary | ICD-10-CM | POA: Diagnosis not present

## 2019-06-18 DIAGNOSIS — I10 Essential (primary) hypertension: Secondary | ICD-10-CM | POA: Diagnosis not present

## 2019-06-18 DIAGNOSIS — N1831 Chronic kidney disease, stage 3a: Secondary | ICD-10-CM | POA: Diagnosis not present

## 2019-06-18 DIAGNOSIS — Z862 Personal history of diseases of the blood and blood-forming organs and certain disorders involving the immune mechanism: Secondary | ICD-10-CM | POA: Diagnosis not present

## 2019-09-10 DIAGNOSIS — M25561 Pain in right knee: Secondary | ICD-10-CM | POA: Diagnosis not present

## 2019-09-19 DIAGNOSIS — M25561 Pain in right knee: Secondary | ICD-10-CM | POA: Diagnosis not present

## 2019-09-26 DIAGNOSIS — M25561 Pain in right knee: Secondary | ICD-10-CM | POA: Diagnosis not present

## 2020-06-03 DIAGNOSIS — I10 Essential (primary) hypertension: Secondary | ICD-10-CM | POA: Diagnosis not present

## 2020-06-03 DIAGNOSIS — D509 Iron deficiency anemia, unspecified: Secondary | ICD-10-CM | POA: Diagnosis not present

## 2020-06-03 DIAGNOSIS — R7303 Prediabetes: Secondary | ICD-10-CM | POA: Diagnosis not present

## 2020-06-03 DIAGNOSIS — K5909 Other constipation: Secondary | ICD-10-CM | POA: Diagnosis not present

## 2020-06-10 DIAGNOSIS — M5416 Radiculopathy, lumbar region: Secondary | ICD-10-CM | POA: Diagnosis not present

## 2020-06-12 DIAGNOSIS — Z20822 Contact with and (suspected) exposure to covid-19: Secondary | ICD-10-CM | POA: Diagnosis not present

## 2020-06-12 DIAGNOSIS — R059 Cough, unspecified: Secondary | ICD-10-CM | POA: Diagnosis not present

## 2020-06-12 DIAGNOSIS — R52 Pain, unspecified: Secondary | ICD-10-CM | POA: Diagnosis not present

## 2020-06-18 DIAGNOSIS — M5416 Radiculopathy, lumbar region: Secondary | ICD-10-CM | POA: Diagnosis not present

## 2020-06-18 DIAGNOSIS — N951 Menopausal and female climacteric states: Secondary | ICD-10-CM | POA: Diagnosis not present

## 2020-06-18 DIAGNOSIS — N898 Other specified noninflammatory disorders of vagina: Secondary | ICD-10-CM | POA: Diagnosis not present

## 2020-06-18 DIAGNOSIS — Z1211 Encounter for screening for malignant neoplasm of colon: Secondary | ICD-10-CM | POA: Diagnosis not present

## 2020-06-18 DIAGNOSIS — Z01419 Encounter for gynecological examination (general) (routine) without abnormal findings: Secondary | ICD-10-CM | POA: Diagnosis not present

## 2020-06-18 DIAGNOSIS — Z1231 Encounter for screening mammogram for malignant neoplasm of breast: Secondary | ICD-10-CM | POA: Diagnosis not present

## 2020-06-18 DIAGNOSIS — Z124 Encounter for screening for malignant neoplasm of cervix: Secondary | ICD-10-CM | POA: Diagnosis not present

## 2020-06-30 DIAGNOSIS — H524 Presbyopia: Secondary | ICD-10-CM | POA: Diagnosis not present

## 2020-07-02 ENCOUNTER — Other Ambulatory Visit: Payer: Self-pay | Admitting: Obstetrics and Gynecology

## 2020-07-02 DIAGNOSIS — M5416 Radiculopathy, lumbar region: Secondary | ICD-10-CM | POA: Diagnosis not present

## 2020-07-02 DIAGNOSIS — R928 Other abnormal and inconclusive findings on diagnostic imaging of breast: Secondary | ICD-10-CM

## 2020-07-10 DIAGNOSIS — M5416 Radiculopathy, lumbar region: Secondary | ICD-10-CM | POA: Diagnosis not present

## 2020-07-13 DIAGNOSIS — N1831 Chronic kidney disease, stage 3a: Secondary | ICD-10-CM | POA: Diagnosis not present

## 2020-07-14 DIAGNOSIS — I10 Essential (primary) hypertension: Secondary | ICD-10-CM | POA: Diagnosis not present

## 2020-07-14 DIAGNOSIS — N1831 Chronic kidney disease, stage 3a: Secondary | ICD-10-CM | POA: Diagnosis not present

## 2020-07-14 DIAGNOSIS — Z862 Personal history of diseases of the blood and blood-forming organs and certain disorders involving the immune mechanism: Secondary | ICD-10-CM | POA: Diagnosis not present

## 2020-07-17 ENCOUNTER — Ambulatory Visit
Admission: RE | Admit: 2020-07-17 | Discharge: 2020-07-17 | Disposition: A | Discharge status: Home / Self Care | Payer: BC Managed Care – PPO | Source: Ambulatory Visit | Attending: Obstetrics and Gynecology | Admitting: Obstetrics and Gynecology

## 2020-07-17 ENCOUNTER — Other Ambulatory Visit: Payer: Self-pay

## 2020-07-17 ENCOUNTER — Ambulatory Visit: Payer: BC Managed Care – PPO

## 2020-07-17 DIAGNOSIS — N6489 Other specified disorders of breast: Secondary | ICD-10-CM | POA: Diagnosis not present

## 2020-07-17 DIAGNOSIS — R928 Other abnormal and inconclusive findings on diagnostic imaging of breast: Secondary | ICD-10-CM

## 2020-07-23 DIAGNOSIS — D509 Iron deficiency anemia, unspecified: Secondary | ICD-10-CM | POA: Diagnosis not present

## 2020-07-23 DIAGNOSIS — E669 Obesity, unspecified: Secondary | ICD-10-CM | POA: Diagnosis not present

## 2020-07-23 DIAGNOSIS — K5904 Chronic idiopathic constipation: Secondary | ICD-10-CM | POA: Diagnosis not present

## 2020-07-23 DIAGNOSIS — Z1211 Encounter for screening for malignant neoplasm of colon: Secondary | ICD-10-CM | POA: Diagnosis not present

## 2020-08-18 DIAGNOSIS — K219 Gastro-esophageal reflux disease without esophagitis: Secondary | ICD-10-CM | POA: Diagnosis not present

## 2020-09-04 DIAGNOSIS — J019 Acute sinusitis, unspecified: Secondary | ICD-10-CM | POA: Diagnosis not present

## 2020-09-04 DIAGNOSIS — B9689 Other specified bacterial agents as the cause of diseases classified elsewhere: Secondary | ICD-10-CM | POA: Diagnosis not present

## 2020-09-08 DIAGNOSIS — M9904 Segmental and somatic dysfunction of sacral region: Secondary | ICD-10-CM | POA: Diagnosis not present

## 2020-09-08 DIAGNOSIS — M9903 Segmental and somatic dysfunction of lumbar region: Secondary | ICD-10-CM | POA: Diagnosis not present

## 2020-09-08 DIAGNOSIS — M9905 Segmental and somatic dysfunction of pelvic region: Secondary | ICD-10-CM | POA: Diagnosis not present

## 2020-09-08 DIAGNOSIS — M62830 Muscle spasm of back: Secondary | ICD-10-CM | POA: Diagnosis not present

## 2020-09-09 DIAGNOSIS — M62830 Muscle spasm of back: Secondary | ICD-10-CM | POA: Diagnosis not present

## 2020-09-09 DIAGNOSIS — M9904 Segmental and somatic dysfunction of sacral region: Secondary | ICD-10-CM | POA: Diagnosis not present

## 2020-09-09 DIAGNOSIS — M9903 Segmental and somatic dysfunction of lumbar region: Secondary | ICD-10-CM | POA: Diagnosis not present

## 2020-09-09 DIAGNOSIS — N289 Disorder of kidney and ureter, unspecified: Secondary | ICD-10-CM | POA: Diagnosis not present

## 2020-09-09 DIAGNOSIS — I10 Essential (primary) hypertension: Secondary | ICD-10-CM | POA: Diagnosis not present

## 2020-09-09 DIAGNOSIS — M9905 Segmental and somatic dysfunction of pelvic region: Secondary | ICD-10-CM | POA: Diagnosis not present

## 2020-09-09 DIAGNOSIS — E119 Type 2 diabetes mellitus without complications: Secondary | ICD-10-CM | POA: Diagnosis not present

## 2020-09-11 DIAGNOSIS — M9905 Segmental and somatic dysfunction of pelvic region: Secondary | ICD-10-CM | POA: Diagnosis not present

## 2020-09-11 DIAGNOSIS — M9904 Segmental and somatic dysfunction of sacral region: Secondary | ICD-10-CM | POA: Diagnosis not present

## 2020-09-11 DIAGNOSIS — M9903 Segmental and somatic dysfunction of lumbar region: Secondary | ICD-10-CM | POA: Diagnosis not present

## 2020-09-11 DIAGNOSIS — M62830 Muscle spasm of back: Secondary | ICD-10-CM | POA: Diagnosis not present

## 2020-09-14 DIAGNOSIS — M9905 Segmental and somatic dysfunction of pelvic region: Secondary | ICD-10-CM | POA: Diagnosis not present

## 2020-09-14 DIAGNOSIS — M62830 Muscle spasm of back: Secondary | ICD-10-CM | POA: Diagnosis not present

## 2020-09-14 DIAGNOSIS — M9903 Segmental and somatic dysfunction of lumbar region: Secondary | ICD-10-CM | POA: Diagnosis not present

## 2020-09-14 DIAGNOSIS — M9904 Segmental and somatic dysfunction of sacral region: Secondary | ICD-10-CM | POA: Diagnosis not present

## 2020-09-16 DIAGNOSIS — M9905 Segmental and somatic dysfunction of pelvic region: Secondary | ICD-10-CM | POA: Diagnosis not present

## 2020-09-16 DIAGNOSIS — M9904 Segmental and somatic dysfunction of sacral region: Secondary | ICD-10-CM | POA: Diagnosis not present

## 2020-09-16 DIAGNOSIS — M62830 Muscle spasm of back: Secondary | ICD-10-CM | POA: Diagnosis not present

## 2020-09-16 DIAGNOSIS — M9903 Segmental and somatic dysfunction of lumbar region: Secondary | ICD-10-CM | POA: Diagnosis not present

## 2020-09-18 DIAGNOSIS — M62830 Muscle spasm of back: Secondary | ICD-10-CM | POA: Diagnosis not present

## 2020-09-18 DIAGNOSIS — M9904 Segmental and somatic dysfunction of sacral region: Secondary | ICD-10-CM | POA: Diagnosis not present

## 2020-09-18 DIAGNOSIS — M9903 Segmental and somatic dysfunction of lumbar region: Secondary | ICD-10-CM | POA: Diagnosis not present

## 2020-09-18 DIAGNOSIS — M9905 Segmental and somatic dysfunction of pelvic region: Secondary | ICD-10-CM | POA: Diagnosis not present

## 2020-09-25 DIAGNOSIS — M9903 Segmental and somatic dysfunction of lumbar region: Secondary | ICD-10-CM | POA: Diagnosis not present

## 2020-09-25 DIAGNOSIS — M62830 Muscle spasm of back: Secondary | ICD-10-CM | POA: Diagnosis not present

## 2020-09-25 DIAGNOSIS — M9905 Segmental and somatic dysfunction of pelvic region: Secondary | ICD-10-CM | POA: Diagnosis not present

## 2020-09-25 DIAGNOSIS — M9904 Segmental and somatic dysfunction of sacral region: Secondary | ICD-10-CM | POA: Diagnosis not present

## 2020-10-02 DIAGNOSIS — M9904 Segmental and somatic dysfunction of sacral region: Secondary | ICD-10-CM | POA: Diagnosis not present

## 2020-10-02 DIAGNOSIS — M62830 Muscle spasm of back: Secondary | ICD-10-CM | POA: Diagnosis not present

## 2020-10-02 DIAGNOSIS — M9903 Segmental and somatic dysfunction of lumbar region: Secondary | ICD-10-CM | POA: Diagnosis not present

## 2020-10-02 DIAGNOSIS — M9905 Segmental and somatic dysfunction of pelvic region: Secondary | ICD-10-CM | POA: Diagnosis not present

## 2020-10-16 DIAGNOSIS — I10 Essential (primary) hypertension: Secondary | ICD-10-CM | POA: Diagnosis not present

## 2020-10-20 DIAGNOSIS — N182 Chronic kidney disease, stage 2 (mild): Secondary | ICD-10-CM | POA: Diagnosis not present

## 2020-10-20 DIAGNOSIS — I10 Essential (primary) hypertension: Secondary | ICD-10-CM | POA: Diagnosis not present

## 2020-10-20 DIAGNOSIS — Z862 Personal history of diseases of the blood and blood-forming organs and certain disorders involving the immune mechanism: Secondary | ICD-10-CM | POA: Diagnosis not present

## 2020-10-20 DIAGNOSIS — Z6830 Body mass index (BMI) 30.0-30.9, adult: Secondary | ICD-10-CM | POA: Diagnosis not present

## 2020-11-23 DIAGNOSIS — E119 Type 2 diabetes mellitus without complications: Secondary | ICD-10-CM | POA: Diagnosis not present

## 2020-11-23 DIAGNOSIS — N289 Disorder of kidney and ureter, unspecified: Secondary | ICD-10-CM | POA: Diagnosis not present

## 2020-12-01 DIAGNOSIS — I10 Essential (primary) hypertension: Secondary | ICD-10-CM | POA: Diagnosis not present

## 2020-12-01 DIAGNOSIS — E119 Type 2 diabetes mellitus without complications: Secondary | ICD-10-CM | POA: Diagnosis not present

## 2020-12-01 DIAGNOSIS — M5430 Sciatica, unspecified side: Secondary | ICD-10-CM | POA: Diagnosis not present

## 2020-12-01 DIAGNOSIS — N289 Disorder of kidney and ureter, unspecified: Secondary | ICD-10-CM | POA: Diagnosis not present

## 2021-02-05 DIAGNOSIS — U071 COVID-19: Secondary | ICD-10-CM | POA: Diagnosis not present

## 2021-02-19 DIAGNOSIS — M545 Low back pain, unspecified: Secondary | ICD-10-CM | POA: Diagnosis not present

## 2021-02-19 DIAGNOSIS — M25551 Pain in right hip: Secondary | ICD-10-CM | POA: Diagnosis not present

## 2021-03-11 DIAGNOSIS — M545 Low back pain, unspecified: Secondary | ICD-10-CM | POA: Diagnosis not present

## 2021-04-05 DIAGNOSIS — M545 Low back pain, unspecified: Secondary | ICD-10-CM | POA: Diagnosis not present

## 2021-04-20 DIAGNOSIS — M5416 Radiculopathy, lumbar region: Secondary | ICD-10-CM | POA: Diagnosis not present

## 2021-05-03 DIAGNOSIS — M5416 Radiculopathy, lumbar region: Secondary | ICD-10-CM | POA: Diagnosis not present

## 2021-05-20 DIAGNOSIS — I10 Essential (primary) hypertension: Secondary | ICD-10-CM | POA: Diagnosis not present

## 2021-05-20 DIAGNOSIS — N182 Chronic kidney disease, stage 2 (mild): Secondary | ICD-10-CM | POA: Diagnosis not present

## 2021-05-28 DIAGNOSIS — Z862 Personal history of diseases of the blood and blood-forming organs and certain disorders involving the immune mechanism: Secondary | ICD-10-CM | POA: Diagnosis not present

## 2021-05-28 DIAGNOSIS — I10 Essential (primary) hypertension: Secondary | ICD-10-CM | POA: Diagnosis not present

## 2021-05-28 DIAGNOSIS — N182 Chronic kidney disease, stage 2 (mild): Secondary | ICD-10-CM | POA: Diagnosis not present

## 2021-05-31 DIAGNOSIS — M5416 Radiculopathy, lumbar region: Secondary | ICD-10-CM | POA: Diagnosis not present

## 2021-06-11 DIAGNOSIS — R519 Headache, unspecified: Secondary | ICD-10-CM | POA: Diagnosis not present

## 2021-06-11 DIAGNOSIS — D509 Iron deficiency anemia, unspecified: Secondary | ICD-10-CM | POA: Diagnosis not present

## 2021-06-11 DIAGNOSIS — I10 Essential (primary) hypertension: Secondary | ICD-10-CM | POA: Diagnosis not present

## 2021-06-11 DIAGNOSIS — E1169 Type 2 diabetes mellitus with other specified complication: Secondary | ICD-10-CM | POA: Diagnosis not present

## 2021-06-11 DIAGNOSIS — Z6831 Body mass index (BMI) 31.0-31.9, adult: Secondary | ICD-10-CM | POA: Diagnosis not present

## 2021-06-11 DIAGNOSIS — J988 Other specified respiratory disorders: Secondary | ICD-10-CM | POA: Diagnosis not present

## 2021-06-11 DIAGNOSIS — Z0000 Encounter for general adult medical examination without abnormal findings: Secondary | ICD-10-CM | POA: Diagnosis not present

## 2021-06-17 DIAGNOSIS — M5416 Radiculopathy, lumbar region: Secondary | ICD-10-CM | POA: Diagnosis not present

## 2021-07-02 DIAGNOSIS — N182 Chronic kidney disease, stage 2 (mild): Secondary | ICD-10-CM | POA: Diagnosis not present

## 2021-07-02 DIAGNOSIS — Z862 Personal history of diseases of the blood and blood-forming organs and certain disorders involving the immune mechanism: Secondary | ICD-10-CM | POA: Diagnosis not present

## 2021-07-02 DIAGNOSIS — I10 Essential (primary) hypertension: Secondary | ICD-10-CM | POA: Diagnosis not present

## 2021-07-13 DIAGNOSIS — I10 Essential (primary) hypertension: Secondary | ICD-10-CM | POA: Diagnosis not present

## 2021-07-13 DIAGNOSIS — Z9889 Other specified postprocedural states: Secondary | ICD-10-CM | POA: Diagnosis not present

## 2021-07-13 DIAGNOSIS — N189 Chronic kidney disease, unspecified: Secondary | ICD-10-CM | POA: Diagnosis not present

## 2021-07-13 DIAGNOSIS — Z01419 Encounter for gynecological examination (general) (routine) without abnormal findings: Secondary | ICD-10-CM | POA: Diagnosis not present

## 2021-07-26 DIAGNOSIS — R928 Other abnormal and inconclusive findings on diagnostic imaging of breast: Secondary | ICD-10-CM | POA: Diagnosis not present

## 2021-07-26 DIAGNOSIS — N6322 Unspecified lump in the left breast, upper inner quadrant: Secondary | ICD-10-CM | POA: Diagnosis not present

## 2021-08-16 DIAGNOSIS — R030 Elevated blood-pressure reading, without diagnosis of hypertension: Secondary | ICD-10-CM | POA: Diagnosis not present

## 2021-08-16 DIAGNOSIS — E559 Vitamin D deficiency, unspecified: Secondary | ICD-10-CM | POA: Diagnosis not present

## 2021-08-16 DIAGNOSIS — Z79899 Other long term (current) drug therapy: Secondary | ICD-10-CM | POA: Diagnosis not present

## 2021-08-16 DIAGNOSIS — R5381 Other malaise: Secondary | ICD-10-CM | POA: Diagnosis not present

## 2021-08-16 DIAGNOSIS — R635 Abnormal weight gain: Secondary | ICD-10-CM | POA: Diagnosis not present

## 2021-09-03 DIAGNOSIS — Z03818 Encounter for observation for suspected exposure to other biological agents ruled out: Secondary | ICD-10-CM | POA: Diagnosis not present

## 2021-09-03 DIAGNOSIS — J029 Acute pharyngitis, unspecified: Secondary | ICD-10-CM | POA: Diagnosis not present

## 2021-09-03 DIAGNOSIS — R0981 Nasal congestion: Secondary | ICD-10-CM | POA: Diagnosis not present

## 2021-09-06 DIAGNOSIS — U071 COVID-19: Secondary | ICD-10-CM | POA: Diagnosis not present

## 2021-10-18 DIAGNOSIS — N289 Disorder of kidney and ureter, unspecified: Secondary | ICD-10-CM | POA: Diagnosis not present

## 2021-10-18 DIAGNOSIS — R609 Edema, unspecified: Secondary | ICD-10-CM | POA: Diagnosis not present

## 2021-10-18 DIAGNOSIS — E1169 Type 2 diabetes mellitus with other specified complication: Secondary | ICD-10-CM | POA: Diagnosis not present

## 2021-10-18 DIAGNOSIS — I10 Essential (primary) hypertension: Secondary | ICD-10-CM | POA: Diagnosis not present

## 2021-10-29 DIAGNOSIS — Z6831 Body mass index (BMI) 31.0-31.9, adult: Secondary | ICD-10-CM | POA: Diagnosis not present

## 2021-10-29 DIAGNOSIS — E119 Type 2 diabetes mellitus without complications: Secondary | ICD-10-CM | POA: Diagnosis not present

## 2021-11-26 DIAGNOSIS — T50995A Adverse effect of other drugs, medicaments and biological substances, initial encounter: Secondary | ICD-10-CM | POA: Diagnosis not present

## 2021-11-26 DIAGNOSIS — R21 Rash and other nonspecific skin eruption: Secondary | ICD-10-CM | POA: Diagnosis not present

## 2021-11-26 DIAGNOSIS — L299 Pruritus, unspecified: Secondary | ICD-10-CM | POA: Diagnosis not present

## 2021-11-29 DIAGNOSIS — I10 Essential (primary) hypertension: Secondary | ICD-10-CM | POA: Diagnosis not present

## 2021-11-29 DIAGNOSIS — Z862 Personal history of diseases of the blood and blood-forming organs and certain disorders involving the immune mechanism: Secondary | ICD-10-CM | POA: Diagnosis not present

## 2021-11-29 DIAGNOSIS — N182 Chronic kidney disease, stage 2 (mild): Secondary | ICD-10-CM | POA: Diagnosis not present

## 2021-12-15 DIAGNOSIS — Z6831 Body mass index (BMI) 31.0-31.9, adult: Secondary | ICD-10-CM | POA: Diagnosis not present

## 2021-12-15 DIAGNOSIS — E1169 Type 2 diabetes mellitus with other specified complication: Secondary | ICD-10-CM | POA: Diagnosis not present

## 2021-12-15 DIAGNOSIS — L304 Erythema intertrigo: Secondary | ICD-10-CM | POA: Diagnosis not present

## 2021-12-15 DIAGNOSIS — L309 Dermatitis, unspecified: Secondary | ICD-10-CM | POA: Diagnosis not present

## 2022-01-05 DIAGNOSIS — E8889 Other specified metabolic disorders: Secondary | ICD-10-CM | POA: Diagnosis not present

## 2022-01-05 DIAGNOSIS — E559 Vitamin D deficiency, unspecified: Secondary | ICD-10-CM | POA: Diagnosis not present

## 2022-01-05 DIAGNOSIS — Z1331 Encounter for screening for depression: Secondary | ICD-10-CM | POA: Diagnosis not present

## 2022-01-05 DIAGNOSIS — D509 Iron deficiency anemia, unspecified: Secondary | ICD-10-CM | POA: Diagnosis not present

## 2022-01-05 DIAGNOSIS — Z8639 Personal history of other endocrine, nutritional and metabolic disease: Secondary | ICD-10-CM | POA: Diagnosis not present

## 2022-01-05 DIAGNOSIS — I10 Essential (primary) hypertension: Secondary | ICD-10-CM | POA: Diagnosis not present

## 2022-01-05 DIAGNOSIS — E119 Type 2 diabetes mellitus without complications: Secondary | ICD-10-CM | POA: Diagnosis not present

## 2022-01-20 DIAGNOSIS — I10 Essential (primary) hypertension: Secondary | ICD-10-CM | POA: Diagnosis not present

## 2022-01-20 DIAGNOSIS — Z7189 Other specified counseling: Secondary | ICD-10-CM | POA: Diagnosis not present

## 2022-01-20 DIAGNOSIS — L309 Dermatitis, unspecified: Secondary | ICD-10-CM | POA: Diagnosis not present

## 2022-01-20 DIAGNOSIS — N182 Chronic kidney disease, stage 2 (mild): Secondary | ICD-10-CM | POA: Diagnosis not present

## 2022-01-20 DIAGNOSIS — E118 Type 2 diabetes mellitus with unspecified complications: Secondary | ICD-10-CM | POA: Diagnosis not present

## 2022-01-21 DIAGNOSIS — L309 Dermatitis, unspecified: Secondary | ICD-10-CM | POA: Diagnosis not present

## 2022-01-21 DIAGNOSIS — E669 Obesity, unspecified: Secondary | ICD-10-CM | POA: Diagnosis not present

## 2022-01-21 DIAGNOSIS — I10 Essential (primary) hypertension: Secondary | ICD-10-CM | POA: Diagnosis not present

## 2022-01-21 DIAGNOSIS — E1169 Type 2 diabetes mellitus with other specified complication: Secondary | ICD-10-CM | POA: Diagnosis not present

## 2022-01-27 DIAGNOSIS — L308 Other specified dermatitis: Secondary | ICD-10-CM | POA: Diagnosis not present

## 2022-01-27 DIAGNOSIS — L818 Other specified disorders of pigmentation: Secondary | ICD-10-CM | POA: Diagnosis not present

## 2022-02-01 DIAGNOSIS — L309 Dermatitis, unspecified: Secondary | ICD-10-CM | POA: Diagnosis not present

## 2022-02-01 DIAGNOSIS — L408 Other psoriasis: Secondary | ICD-10-CM | POA: Diagnosis not present

## 2022-02-01 DIAGNOSIS — D485 Neoplasm of uncertain behavior of skin: Secondary | ICD-10-CM | POA: Diagnosis not present

## 2022-02-22 DIAGNOSIS — L403 Pustulosis palmaris et plantaris: Secondary | ICD-10-CM | POA: Diagnosis not present

## 2022-02-22 DIAGNOSIS — K1321 Leukoplakia of oral mucosa, including tongue: Secondary | ICD-10-CM | POA: Diagnosis not present

## 2022-02-22 DIAGNOSIS — E119 Type 2 diabetes mellitus without complications: Secondary | ICD-10-CM | POA: Diagnosis not present

## 2022-02-22 DIAGNOSIS — K219 Gastro-esophageal reflux disease without esophagitis: Secondary | ICD-10-CM | POA: Diagnosis not present

## 2022-03-09 DIAGNOSIS — L403 Pustulosis palmaris et plantaris: Secondary | ICD-10-CM | POA: Diagnosis not present

## 2022-03-09 DIAGNOSIS — Z79899 Other long term (current) drug therapy: Secondary | ICD-10-CM | POA: Diagnosis not present

## 2022-03-17 DIAGNOSIS — E1169 Type 2 diabetes mellitus with other specified complication: Secondary | ICD-10-CM | POA: Diagnosis not present

## 2022-03-17 DIAGNOSIS — I10 Essential (primary) hypertension: Secondary | ICD-10-CM | POA: Diagnosis not present

## 2022-03-17 DIAGNOSIS — L089 Local infection of the skin and subcutaneous tissue, unspecified: Secondary | ICD-10-CM | POA: Diagnosis not present

## 2022-03-17 DIAGNOSIS — L309 Dermatitis, unspecified: Secondary | ICD-10-CM | POA: Diagnosis not present

## 2022-03-26 ENCOUNTER — Ambulatory Visit (HOSPITAL_COMMUNITY)
Admission: EM | Admit: 2022-03-26 | Discharge: 2022-03-26 | Disposition: A | Discharge status: Home / Self Care | Payer: BC Managed Care – PPO | Attending: Internal Medicine | Admitting: Internal Medicine

## 2022-03-26 ENCOUNTER — Encounter (HOSPITAL_COMMUNITY): Payer: Self-pay

## 2022-03-26 DIAGNOSIS — B370 Candidal stomatitis: Secondary | ICD-10-CM | POA: Diagnosis not present

## 2022-03-26 DIAGNOSIS — L401 Generalized pustular psoriasis: Secondary | ICD-10-CM

## 2022-03-26 DIAGNOSIS — R21 Rash and other nonspecific skin eruption: Secondary | ICD-10-CM

## 2022-03-26 MED ORDER — FLUCONAZOLE 150 MG PO TABS: 150.0000 mg | ORAL_TABLET | Freq: Every day | ORAL | 0 refills | Status: DC

## 2022-03-26 MED ORDER — NYSTATIN 100000 UNIT/ML MT SUSP: 5.0000 mL | Freq: Four times a day (QID) | OROMUCOSAL | 1 refills | Status: DC

## 2022-03-26 NOTE — ED Triage Notes (Signed)
Pt is here skin irritation on hands and mouth x2wks

## 2022-03-26 NOTE — ED Provider Notes (Signed)
[facility]    CSN: [id number] Arrival date & time: 03/26/22  1701      History   Chief Complaint Chief Complaint  Patient presents with   Allergic Reaction    HPI Sandra Carpenter is a 46 y.o. female.   Patient presents to urgent care for evaluation of sore throat, mouth lesions, and progressively worsening skin lesions to the bilateral hands and feet.  Patient reports she has had multiple different dermatologic allergic reactions to different medications related to diabetes starting at the beginning of this year in June when she was placed on metformin, then Trulicity, then Ozempic.  She no longer takes any of these medications due to forming rash.  In late September 2023, patient was seen by dermatology specialist for rash to the hands and placed on Acitretin 25mg  to be taken once daily.  At that time, the rash to the hands was dry, flaky, and very inflamed with multiple pustules present to the hands.  A biopsy was performed of the rash and patient was diagnosed with pustular psoriasis by dermatologist.  Since starting the Acitretin medication, patient has experienced multiple side effects associated with taking medication including dry mouth, blurred vision, as well as worsening rash symptoms.  Patient stopped taking Acitretin medication a few days ago due to side effects and worsening symptoms.  Mouth irritation began to worsen a couple of weeks ago and patient is experiencing pain to the throat as well as diffuse tongue lesions causing her to have a difficult time eating certain foods due to pain.  Patient has tried multiple different antifungal and steroid creams both prescription and over-the-counter and nothing seems to be helping.  She has been seen by her eye doctor for evaluation of the blurry vision and has an appointment to follow-up with her primary care provider on Thursday of next week (in 5 days).  She is currently on a 12-day steroid burst of 20 mg of  prednisone and has 2-3 more days left of the steroid burst prescribed by dermatology.  She plans to get a second opinion with [facility] dermatology as well but is requesting assistance with her throat and mouth symptoms today.  No fever or chills, nausea, vomiting, abdominal pain, or URI symptoms.      Past Medical History:  Diagnosis Date   Chronic kidney disease    Hypertension    Pyelonephritis     Patient Active Problem List   Diagnosis Date Noted   Microcytic anemia 12/27/2016   Pyelonephritis     Past Surgical History:  Procedure Laterality Date   CESAREAN SECTION     TUBAL LIGATION      OB History   No obstetric history on file.      Home Medications    Prior to Admission medications   Medication Sig Start Date End Date Taking? Authorizing Provider  fluconazole (DIFLUCAN) 150 MG tablet Take 1 tablet (150 mg total) by mouth daily for 5 days. 03/26/22 03/31/22 Yes Carlisle BeersStanhope, Arrow Emmerich M, FNP  magic mouthwash (nystatin, lidocaine, diphenhydrAMINE) suspension Take 5 mLs by mouth 4 (four) times daily. 03/26/22  Yes Carlisle BeersStanhope, Atilla Zollner M, FNP  albuterol (PROVENTIL) (5 MG/ML) 0.5% nebulizer solution Take 0.5 mLs (2.5 mg total) by nebulization every 4 (four) hours as needed for wheezing or shortness of breath. 07/12/17   Zadie RhineWickline, Donald, MD  amLODipine (NORVASC) 5 MG tablet Take 5 mg by mouth daily.    [provider]  Biotin 1 MG CAPS Take  by mouth.    [provider]  Fe Fum-FePoly-Vit C-Vit B3 (INTEGRA) 62.5-62.5-40-3 MG CAPS Take by mouth.    [provider]  ferrous sulfate 325 (65 FE) MG EC tablet Take 1 tablet (325 mg total) by mouth 3 (three) times daily with meals. 01/25/17 02/24/17  Daisy Blossom, MD  hydrochlorothiazide (HYDRODIURIL) 25 MG tablet hydrochlorothiazide 25 mg tabs    [provider]  ibuprofen (ADVIL,MOTRIN) 800 MG tablet ibuprofen 800 mg tablet  TAKE 1 TABLET EVERY 8 HOURS BY MOUTH    [provider]   linaclotide (LINZESS) 145 MCG CAPS capsule Take 145 mcg by mouth daily before breakfast.    [provider]  loratadine (CLARITIN) 10 MG tablet Take by mouth.    [provider]  valACYclovir (VALTREX) 500 MG tablet valacyclovir 500 mg tablet  TAKE 1 TABLET BY MOUTH EVERY DAY    [provider]  fluticasone (FLONASE) 50 MCG/ACT nasal spray fluticasone 50 mcg/actuation nasal spray,suspension  2 SPRAYS IN EACH NOSTRIL ONCE A DAY IF NEEDED  10/26/18  [provider]  promethazine (PHENERGAN) 25 MG tablet promethazine 25 mg tablet  Take 1 tablet every 4 hours by oral route.  10/26/18  [provider]    Family History Family History  Problem Relation Age of Onset   Heart failure Mother    Hypertension Mother    Hypertension Father     Social History Social History   Tobacco Use   Smoking status: Never   Smokeless tobacco: Never  Vaping Use   Vaping Use: Never used  Substance Use Topics   Alcohol use: Yes    Comment: occassional    Drug use: No     Allergies   Penicillins and Sulfa antibiotics   Review of Systems Review of Systems Per HPI  Physical Exam Triage Vital Signs ED Triage Vitals  Enc Vitals Group     BP 03/26/22 1752 (!) 149/92     Pulse Rate 03/26/22 1752 79     Resp 03/26/22 1752 12     Temp 03/26/22 1752 98.3 F (36.8 C)     Temp Source 03/26/22 1752 Oral     SpO2 03/26/22 1752 98 %     Weight --      Height --      Head Circumference --      Peak Flow --      Pain Score 03/26/22 1754 10     Pain Loc --      Pain Edu? --      Excl. in GC? --    No data found.  Updated Vital Signs BP (!) 149/92 (BP Location: Right Arm)   Pulse 79   Temp 98.3 F (36.8 C) (Oral)   Resp 12   SpO2 98%   Visual Acuity Right Eye Distance:   Left Eye Distance:   Bilateral Distance:    Right Eye Near:   Left Eye Near:    Bilateral Near:     Physical Exam Vitals and nursing note reviewed.  Constitutional:       Appearance: Normal appearance. She is not ill-appearing or toxic-appearing.  HENT:     Head: Normocephalic and atraumatic.     Right Ear: Hearing and external ear normal.     Left Ear: Hearing and external ear normal.     Nose: Nose normal.     Mouth/Throat:     Lips: Pink.     Mouth: Mucous membranes are moist.  Tongue: Lesions present.     Palate: Lesions present.     Pharynx: Uvula midline. Posterior oropharyngeal erythema present.     Tonsils: No tonsillar exudate or tonsillar abscesses.     Comments: Pinpoint white lesions present to the posterior oropharynx and the hard palate.  White/yellow lesions present to the tongue consistent with oral thrush.  See image below for further detail.  Eyes:     General: Lids are normal. Vision grossly intact. Gaze aligned appropriately.     Extraocular Movements: Extraocular movements intact.     Conjunctiva/sclera: Conjunctivae normal.  Pulmonary:     Effort: Pulmonary effort is normal.  Musculoskeletal:     Cervical back: Neck supple.  Lymphadenopathy:     Cervical: No cervical adenopathy.  Skin:    General: Skin is warm and dry.     Capillary Refill: Capillary refill takes less than 2 seconds.     Findings: Rash present.     Comments: See images of rash to the hands and feet bilaterally below for further detail.  Neurological:     General: No focal deficit present.     Mental Status: She is alert and oriented to person, place, and time. Mental status is at baseline.     Cranial Nerves: No dysarthria or facial asymmetry.  Psychiatric:        Mood and Affect: Mood normal.        Speech: Speech normal.        Behavior: Behavior normal.        Thought Content: Thought content normal.        Judgment: Judgment normal.     Posterior oropharynx  Left foot  Right foot  Right foot  Bilateral hands  Bilateral hands    UC Treatments / Results  Labs (all labs ordered are listed, but only abnormal results are  displayed) Labs Reviewed - No data to display  EKG   Radiology No results found.  Procedures Procedures (including critical care time)  Medications Ordered in UC Medications - No data to display  Initial Impression / Assessment and Plan / UC Course  I have reviewed the triage vital signs and the nursing notes.  Pertinent labs & imaging results that were available during my care of the patient were reviewed by me and considered in my medical decision making (see chart for details).   1.  Oral thrush Presentation is consistent with severe oral thrush likely worsened by prednisone use.  Patient to use Magic mouthwash 4 times daily (every 4 hours) for the next 5 to 7 days.  Diflucan 150 mg once daily for 5 days sent to pharmacy due to severity of oral thrush infection and patient's symptoms.  Advised patient to eat soft and bland foods over the next few days while inflammation reduces.  She may use Tyleno every 6 hours as needed for pain to the throat.  No ibuprofen due to prednisone use.  Patient to continue taking prednisone as prescribed to help with rash.  Patient to follow-up with her primary care provider for ongoing management of pustular psoriasis and has been advised to follow-up with another dermatologist as planned for further evaluation of rash.  She has attempted use of multiple types of prescription and over-the-counter creams/ointments and is currently taking steroid to reduce inflammation to the rash of the hands/feet.  States that the steroid has helped significantly with symptoms but is concerned symptoms will return and worsen when steroid course is finished.  Encouraged follow-up  with PCP and dermatologist as stated above.  She is agreeable with this plan.  Discussed physical exam and available lab work findings in clinic with patient.  Counseled patient regarding appropriate use of medications and potential side effects for all medications recommended or prescribed today.  Discussed red flag signs and symptoms of worsening condition,when to call the PCP office, return to urgent care, and when to seek higher level of care in the emergency department. Patient verbalizes understanding and agreement with plan. All questions answered. Patient discharged in stable condition.    Final Clinical Impressions(s) / UC Diagnoses   Final diagnoses:  Thrush, oral  Pustular psoriasis  Rash and nonspecific skin eruption     Discharge Instructions      Use magic mouth wash 4 times a day for the next 5-7 days. You may swish and swallow of magic mouth wash each use.   Take fluconazole (diflucan) once daily for the next 5 days, this is an antifungal medication and will help with your symptoms.  Follow-up with specialists and PCP as scheduled.    ED Prescriptions     Medication Sig Dispense Auth. Provider   fluconazole (DIFLUCAN) 150 MG tablet Take 1 tablet (150 mg total) by mouth daily for 5 days. 5 tablet Carlisle Beers, FNP   magic mouthwash (nystatin, lidocaine, diphenhydrAMINE) suspension Take 5 mLs by mouth 4 (four) times daily. 200 mL Carlisle Beers, FNP      PDMP not reviewed this encounter.   Carlisle Beers, [place] 03/26/22 2057

## 2022-03-26 NOTE — Discharge Instructions (Addendum)
Use magic mouth wash 4 times a day for the next 5-7 days. You may swish and swallow of magic mouth wash each use.   Take fluconazole (diflucan) once daily for the next 5 days, this is an antifungal medication and will help with your symptoms.  Follow-up with specialists and PCP as scheduled.

## 2022-03-28 DIAGNOSIS — E118 Type 2 diabetes mellitus with unspecified complications: Secondary | ICD-10-CM | POA: Diagnosis not present

## 2022-03-28 DIAGNOSIS — Z9189 Other specified personal risk factors, not elsewhere classified: Secondary | ICD-10-CM | POA: Diagnosis not present

## 2022-03-28 DIAGNOSIS — E1169 Type 2 diabetes mellitus with other specified complication: Secondary | ICD-10-CM | POA: Diagnosis not present

## 2022-03-28 DIAGNOSIS — G43109 Migraine with aura, not intractable, without status migrainosus: Secondary | ICD-10-CM | POA: Diagnosis not present

## 2022-03-28 DIAGNOSIS — L309 Dermatitis, unspecified: Secondary | ICD-10-CM | POA: Diagnosis not present

## 2022-03-29 ENCOUNTER — Encounter: Payer: Self-pay | Admitting: Allergy

## 2022-03-29 ENCOUNTER — Ambulatory Visit: Payer: BC Managed Care – PPO | Admitting: Allergy

## 2022-03-29 VITALS — BP 126/84 | HR 93 | Temp 98.3°F | Resp 16 | Ht 67.0 in | Wt 187.5 lb

## 2022-03-29 DIAGNOSIS — R21 Rash and other nonspecific skin eruption: Secondary | ICD-10-CM

## 2022-03-29 DIAGNOSIS — Z889 Allergy status to unspecified drugs, medicaments and biological substances status: Secondary | ICD-10-CM

## 2022-03-29 DIAGNOSIS — T50905D Adverse effect of unspecified drugs, medicaments and biological substances, subsequent encounter: Secondary | ICD-10-CM

## 2022-03-29 NOTE — Progress Notes (Signed)
New Patient Note  RE: Sandra Carpenter MRN: [id number] DOB: 03-Feb-1976 Date of Office Visit: 03/29/2022  Consult requested by: No ref. provider found Primary care provider: Farris Has, MD  Chief Complaint: Allergic Reaction (Earlier this year A1C was crazy, put on metformin but still continued to spike. Tried truliciy and started having a reaction on hands with blisters and peeling. Did ozempic and noticed the same issue. Has also been having blurry vision, extreme dry mouth, lesions on back of throat, lips have broken out.  Has broken also on her feet. )  History of Present Illness: I had the pleasure of seeing Sandra Carpenter for initial evaluation at the [facility] on 03/29/2022. She is a 46 y.o. female, who is self-referred here for the evaluation of allergic reaction.  Patient had issues with high A1C and she was started on metformin which did not help. Then she tried Trulicity which caused her to have issues with her skin - mainly the skin peeling off her hands.  The injections were once every week and had 4 injections in total. She did notice that maybe the bumps flared after each injections.  She was then switched to ozempic once per week - and immediately noted some rash on her hands. She had 2 injections of this medication.  She went to see a nutritionist to help manage her A1C. She also saw a dermatologist for her skin issues. She tried betamethasone ointments and tried some other creams with no benefit. She had biopsy done which showed pustular psoriasis per patient - biopsy results not available for review during OV. She was started on acitretin 25mg  in September.  She noted some blurry vision after starting this medication with extreme dry mouth.  Had eye exam done for this - she was given some eye drops for this.  She stopped Acitretin on 11/1 and dry mouth somewhat improved but still has vision changes.  She has been having some nausea, lesions on  the throat, vaginal area redness.   Se has been using magic mouthwash with some benefit and topical yeast medications for the vaginal area.   Went to see ENT as well - most likely has reflux.  She is currently on prednisone which has been helping and scheduled to see dermatology in 2 days.   Patient follows with nephrology due to kidney issues.   03/26/2022 UC visit: "Patient presents to urgent care for evaluation of sore throat, mouth lesions, and progressively worsening skin lesions to the bilateral hands and feet.  Patient reports she has had multiple different dermatologic allergic reactions to different medications related to diabetes starting at the beginning of this year in June when she was placed on metformin, then Trulicity, then Ozempic.  She no longer takes any of these medications due to forming rash.  In late September 2023, patient was seen by dermatology specialist for rash to the hands and placed on Acitretin 25mg  to be taken once daily.  At that time, the rash to the hands was dry, flaky, and very inflamed with multiple pustules present to the hands.  A biopsy was performed of the rash and patient was diagnosed with pustular psoriasis by dermatologist.  Since starting the Acitretin medication, patient has experienced multiple side effects associated with taking medication including dry mouth, blurred vision, as well as worsening rash symptoms.  Patient stopped taking Acitretin medication a few days ago due to side effects and worsening symptoms.  Mouth irritation began to worsen  a couple of weeks ago and patient is experiencing pain to the throat as well as diffuse tongue lesions causing her to have a difficult time eating certain foods due to pain.  Patient has tried multiple different antifungal and steroid creams both prescription and over-the-counter and nothing seems to be helping.  She has been seen by her eye doctor for evaluation of the blurry vision and has an appointment to  follow-up with her primary care provider on Thursday of next week (in 5 days).  She is currently on a 12-day steroid burst of 20 mg of prednisone and has 2-3 more days left of the steroid burst prescribed by dermatology.  She plans to get a second opinion with [facility] dermatology as well but is requesting assistance with her throat and mouth symptoms today.  No fever or chills, nausea, vomiting, abdominal pain, or URI symptoms.    1.  Oral thrush Presentation is consistent with severe oral thrush likely worsened by prednisone use.  Patient to use Magic mouthwash 4 times daily (every 4 hours) for the next 5 to 7 days.  Diflucan 150 mg once daily for 5 days sent to pharmacy due to severity of oral thrush infection and patient's symptoms.  Advised patient to eat soft and bland foods over the next few days while inflammation reduces.  She may use Tyleno every 6 hours as needed for pain to the throat.  No ibuprofen due to prednisone use.  Patient to continue taking prednisone as prescribed to help with rash.   Patient to follow-up with her primary care provider for ongoing management of pustular psoriasis and has been advised to follow-up with another dermatologist as planned for further evaluation of rash.  She has attempted use of multiple types of prescription and over-the-counter creams/ointments and is currently taking steroid to reduce inflammation to the rash of the hands/feet.  States that the steroid has helped significantly with symptoms but is concerned symptoms will return and worsen when steroid course is finished.  Encouraged follow-up with PCP and dermatologist as stated above.  She is agreeable with this plan.   Discussed physical exam and available lab work findings in clinic with patient.  Counseled patient regarding appropriate use of medications and potential side effects for all medications recommended or prescribed today. Discussed red flag signs and symptoms of worsening condition,when  to call the PCP office, return to urgent care, and when to seek higher level of care in the emergency department. Patient verbalizes understanding and agreement with plan. All questions answered. Patient discharged in stable condition."  02/22/2022 ENT visit:  "Impression & Plans:  Some of her throat symptoms are still likely reflux related. Recommend complete and strict caffeine avoidance including sweet tea. She should not be drinking sweet tea anyway with her diabetes. Caffeine and sugar can also help keep her dehydrated. Drink more water, sip on water all day long.  The changes to the oral mucosa could be related to the autoimmune process that she is experiencing in her hands. She is starting to improve in both places with her recent medication from the dermatologist so continue to monitor this. We discussed that some of these changes in her mouth can be precancerous. Recheck in 3 months or sooner if she develops any soreness or any problems that seem unusual. "  Assessment and Plan: Zaydah is a 46 y.o. female with: Rash/peeling skin Developed rash/peeling skin after taking Trulicity and Ozempic. Biopsy showed pustular psoriasis? And was started on acitretin which  caused worsening rash, blurry vision, dry oral mucosa with mouth lesions and vaginal irritation. Patient follows with nephrology due to kidney issues. Had rash with sulfa and penicillin in the past.  Unfortunately I don't have any drug testing available for the above items and her symptoms are not typical of an IgE-mediated drug reaction.  I question if she has milder form of SJS - unclear to which exact drug triggered this as she didn't take the medications that typically cause SJS. Either ways the recommendation is to never take Trulicity, Ozempic or any other GLP-1 type medications in the future.  Avoid Acitretin as well. Added these medications to her allergy list.  Follow up with your eye doctor regarding the blurry vision. Keep  dermatology appointment - take biopsy results.  Check with them if they want to get the bloodwork that I ordered. Follow up with nephrologist as well. Make sure to mention your kidney condition to your dermatologist! See below for proper skin care. Finish prednisone as prescribed.  Return if symptoms worsen or fail to improve.  No orders of the defined types were placed in this encounter.  Lab Orders         ANA w/Reflex         C3 and C4         CBC with Differential/Platelet         Comprehensive metabolic panel         C-reactive protein         Tryptase         Sedimentation rate         Thyroid Cascade Profile      Other allergy screening: Asthma: no Rhino conjunctivitis: no Food allergy: no Medication allergy: yes Penicillin and sulfa - perioral rash. Hymenoptera allergy: no Urticaria: no Eczema:no History of recurrent infections suggestive of immunodeficency: no  Diagnostics: None.   Past Medical History: Patient Active Problem List   Diagnosis Date Noted   Rash/peeling skin 03/29/2022   Microcytic anemia 12/27/2016   Pyelonephritis    Past Medical History:  Diagnosis Date   Chronic kidney disease    Hypertension    Pyelonephritis    Past Surgical History: Past Surgical History:  Procedure Laterality Date   CESAREAN SECTION     TUBAL LIGATION     Medication List:  Current Outpatient Medications  Medication Sig Dispense Refill   amLODipine (NORVASC) 5 MG tablet Take 5 mg by mouth daily.     Fe Fum-FePoly-Vit C-Vit B3 (INTEGRA) 62.5-62.5-40-3 MG CAPS Take by mouth.     ibuprofen (ADVIL,MOTRIN) 800 MG tablet ibuprofen 800 mg tablet  TAKE 1 TABLET EVERY 8 HOURS BY MOUTH     linaclotide (LINZESS) 145 MCG CAPS capsule Take 145 mcg by mouth daily before breakfast.     magic mouthwash (nystatin, lidocaine, diphenhydrAMINE) suspension Take 5 mLs by mouth 4 (four) times daily. 200 mL 1   metFORMIN (GLUCOPHAGE) 500 MG tablet Take 500 mg by mouth 2 (two)  times daily.     predniSONE (DELTASONE) 20 MG tablet Take by mouth.     valACYclovir (VALTREX) 500 MG tablet valacyclovir 500 mg tablet  TAKE 1 TABLET BY MOUTH EVERY DAY     valsartan-hydrochlorothiazide (DIOVAN-HCT) 160-12.5 MG tablet Take 1 tablet by mouth daily.     ferrous sulfate 325 (65 FE) MG EC tablet Take 1 tablet (325 mg total) by mouth 3 (three) times daily with meals. 90 tablet 0   hydrochlorothiazide (HYDRODIURIL) 25 MG tablet  hydrochlorothiazide 25 mg tabs (Patient not taking: Reported on 03/29/2022)     No current facility-administered medications for this visit.   Allergies: Allergies  Allergen Reactions   Penicillins Rash    Mouth breaks out  Has patient had a PCN reaction causing immediate rash, facial/tongue/throat swelling, SOB or lightheadedness with hypotension:no  Has patient had a PCN reaction causing severe rash involving mucus membranes or skin necrosis: yes minor rash  Has patient had a PCN reaction that required hospitalization: no  Has patient had a PCN reaction occurring within the last 10 years: no  If all of the above answers are "NO", then may proceed with Cephalosporin use.   Sulfa Antibiotics Rash   Acitretin    Ozempic (0.25 Or 0.5 Mg-Dose) [Semaglutide(0.25 Or 0.5mg -Dos)]    Trulicity [Dulaglutide]    Social History: Social History   Socioeconomic History   Marital status: Married    Spouse name: Not on file   Number of children: Not on file   Years of education: Not on file   Highest education level: Not on file  Occupational History   Not on file  Tobacco Use   Smoking status: Never   Smokeless tobacco: Never  Vaping Use   Vaping Use: Never used  Substance and Sexual Activity   Alcohol use: Yes    Comment: occassional    Drug use: No   Sexual activity: Yes  Other Topics Concern   Not on file  Social History Narrative   Not on file   Social Determinants of Health   Financial Resource Strain: Not on file  Food Insecurity:  Not on file  Transportation Needs: Not on file  Physical Activity: Not on file  Stress: Not on file  Social Connections: Not on file   Lives in a 48 year old house. Smoking: denies Occupation: works from [employer] History: Immunologist in the house: no Engineer, civil (consulting) in the family room: no Carpet in the bedroom: no Heating: gas Cooling: central Pet: no  Family History: Family History  Problem Relation Age of Onset   Heart failure Mother    Hypertension Mother    Hypertension Father    Eczema Son    Review of Systems  Constitutional:  Negative for appetite change, chills, fever and unexpected weight change.  HENT:  Positive for sore throat. Negative for congestion and rhinorrhea.   Eyes:  Positive for visual disturbance. Negative for itching.  Respiratory:  Negative for cough, chest tightness, shortness of breath and wheezing.   Cardiovascular:  Negative for chest pain.  Gastrointestinal:  Positive for nausea. Negative for abdominal pain, constipation, diarrhea and vomiting.  Genitourinary:  Negative for difficulty urinating.  Skin:  Positive for rash.  Neurological:  Negative for headaches.    Objective: BP 126/84   Pulse 93   Temp 98.3 F (36.8 C)   Resp 16   Ht  (1.702 m)   Wt 187 lb 8 oz (85 kg)   SpO2 97%   BMI 29.37 kg/m  Body mass index is 29.37 kg/m. Physical Exam Vitals and nursing note reviewed.  Constitutional:      Appearance: Normal appearance. She is well-developed.  HENT:     Head: Normocephalic and atraumatic.     Right Ear: Tympanic membrane and external ear normal.     Left Ear: Tympanic membrane and external ear normal.     Nose: Nose normal.     Mouth/Throat:     Mouth: Mucous membranes are moist.  Pharynx: Oropharynx is clear.  Eyes:     Conjunctiva/sclera: Conjunctivae normal.  Cardiovascular:     Rate and Rhythm: Normal rate and regular rhythm.     Heart sounds: Normal heart sounds. No murmur heard.    No  friction rub. No gallop.  Pulmonary:     Effort: Pulmonary effort is normal.     Breath sounds: Normal breath sounds. No wheezing, rhonchi or rales.  Musculoskeletal:     Cervical back: Neck supple.  Skin:    General: Skin is warm and dry.     Findings: Rash present.     Comments: Circular hyperpigmented rash on hands and feet b/l, with peeling skin  Neurological:     Mental Status: She is alert and oriented to person, place, and time.  Psychiatric:        Behavior: Behavior normal.    The plan was reviewed with the patient/family, and all questions/concerned were addressed.  It was my pleasure to see Evette CristalKeya today and participate in her care. Please feel free to contact me with any questions or concerns.  Sincerely,  Wyline MoodYoon Meili Kleckley, DO Allergy & Immunology  [facility] [place] office: [phone] [address] [facility] [place] office: [phone]

## 2022-03-29 NOTE — Assessment & Plan Note (Addendum)
Developed rash/peeling skin after taking Trulicity and Ozempic. Biopsy showed pustular psoriasis? And was started on acitretin which caused worsening rash, blurry vision, dry oral mucosa with mouth lesions and vaginal irritation. Patient follows with nephrology due to kidney issues. Had rash with sulfa and penicillin in the past.  Unfortunately I don't have any drug testing available for the above items and her symptoms are not typical of an IgE-mediated drug reaction.  I question if she has milder form of SJS - unclear to which exact drug triggered this as she didn't take the medications that typically cause SJS. Either ways the recommendation is to never take Trulicity, Ozempic or any other GLP-1 type medications in the future.  Avoid Acitretin as well. Added these medications to her allergy list.  Follow up with your eye doctor regarding the blurry vision. Keep dermatology appointment - take biopsy results.  Check with them if they want to get the bloodwork that I ordered. Follow up with nephrologist as well. Make sure to mention your kidney condition to your dermatologist! See below for proper skin care. Finish prednisone as prescribed.

## 2022-03-29 NOTE — Patient Instructions (Addendum)
Rash/skin Unfortunately I don't have testing available for these various drugs but it looks like you had various side effects from them and not a true allergic reaction where you need epinephrine or benadryl to treat. Recommend to avoid truliciy, ozempic and acitretin - I put these meds on your allergy list.  Follow up with your eye doctor regarding the blurry vision. Keep dermatology appointment - take your biopsy results with you. Check with them if they want to get the bloodwork that I ordered. Follow up with your nephrologist as well. Make sure you mention your kidney condition to your dermatologist!  See below for proper skin care. Finish prednisone as prescribed.  Follow up as needed.  Skin care recommendations  Bath time: Always use lukewarm water. AVOID very hot or cold water. Keep bathing time to 5-10 minutes. Do NOT use bubble bath. Use a mild soap and use just enough to wash the dirty areas. Do NOT scrub skin vigorously.  After bathing, pat dry your skin with a towel. Do NOT rub or scrub the skin.  Moisturizers and prescriptions:  ALWAYS apply moisturizers immediately after bathing (within 3 minutes). This helps to lock-in moisture. Use the moisturizer several times a day over the whole body. Good summer moisturizers include: Aveeno, CeraVe, Cetaphil. Good winter moisturizers include: Aquaphor, Vaseline, Cerave, Cetaphil, Eucerin, Vanicream. When using moisturizers along with medications, the moisturizer should be applied about one hour after applying the medication to prevent diluting effect of the medication or moisturize around where you applied the medications. When not using medications, the moisturizer can be continued twice daily as maintenance.  Laundry and clothing: Avoid laundry products with added color or perfumes. Use unscented hypo-allergenic laundry products such as Tide free, Cheer free & gentle, and All free and clear.  If the skin still seems dry or  sensitive, you can try double-rinsing the clothes. Avoid tight or scratchy clothing such as wool. Do not use fabric softeners or dyer sheets.

## 2022-03-31 DIAGNOSIS — R21 Rash and other nonspecific skin eruption: Secondary | ICD-10-CM | POA: Diagnosis not present

## 2022-03-31 DIAGNOSIS — N182 Chronic kidney disease, stage 2 (mild): Secondary | ICD-10-CM | POA: Diagnosis not present

## 2022-04-01 ENCOUNTER — Other Ambulatory Visit: Payer: Self-pay

## 2022-04-01 ENCOUNTER — Encounter (HOSPITAL_BASED_OUTPATIENT_CLINIC_OR_DEPARTMENT_OTHER): Payer: Self-pay | Admitting: Emergency Medicine

## 2022-04-01 ENCOUNTER — Emergency Department (HOSPITAL_BASED_OUTPATIENT_CLINIC_OR_DEPARTMENT_OTHER)
Admission: EM | Admit: 2022-04-01 | Discharge: 2022-04-01 | Disposition: A | Discharge status: Home / Self Care | Payer: BC Managed Care – PPO | Attending: Emergency Medicine | Admitting: Emergency Medicine

## 2022-04-01 DIAGNOSIS — R739 Hyperglycemia, unspecified: Secondary | ICD-10-CM

## 2022-04-01 DIAGNOSIS — E1165 Type 2 diabetes mellitus with hyperglycemia: Secondary | ICD-10-CM | POA: Diagnosis not present

## 2022-04-01 LAB — URINALYSIS, ROUTINE W REFLEX MICROSCOPIC
Bilirubin Urine: NEGATIVE
Glucose, UA: >1000 mg/dL — AB
Hgb urine dipstick: NEGATIVE
Ketones, ur: 15 mg/dL — AB
Leukocytes,Ua: NEGATIVE
Nitrite: NEGATIVE
Protein, ur: 30 mg/dL — AB
Specific Gravity, Urine: 1.028 (ref 1.005–1.030)
pH: 5.5 (ref 5.0–8.0)

## 2022-04-01 LAB — CBC
HCT: 42 % (ref 36.0–46.0)
Hemoglobin: 13.9 g/dL (ref 12.0–15.0)
MCH: 27.5 pg (ref 26.0–34.0)
MCHC: 33.1 g/dL (ref 30.0–36.0)
MCV: 83.2 fL (ref 80.0–100.0)
Platelets: 255 10*3/uL (ref 150–400)
RBC: 5.05 MIL/uL (ref 3.87–5.11)
RDW: 14.2 % (ref 11.5–15.5)
WBC: 9.7 10*3/uL (ref 4.0–10.5)
nRBC: 0 % (ref 0.0–0.2)

## 2022-04-01 LAB — BETA-HYDROXYBUTYRIC ACID: Beta-Hydroxybutyric Acid: 1.26 mmol/L — ABNORMAL HIGH (ref 0.05–0.27)

## 2022-04-01 LAB — BASIC METABOLIC PANEL
Anion gap: 14 (ref 5–15)
BUN: 29 mg/dL — ABNORMAL HIGH (ref 6–20)
CO2: 24 mmol/L (ref 22–32)
Calcium: 10.2 mg/dL (ref 8.9–10.3)
Chloride: 89 mmol/L — ABNORMAL LOW (ref 98–111)
Creatinine, Ser: 1.59 mg/dL — ABNORMAL HIGH (ref 0.44–1.00)
GFR, Estimated: 40 mL/min — ABNORMAL LOW (ref >60)
Glucose, Bld: 611 mg/dL (ref 70–99)
Potassium: 4.3 mmol/L (ref 3.5–5.1)
Sodium: 127 mmol/L — ABNORMAL LOW (ref 135–145)

## 2022-04-01 LAB — I-STAT VENOUS BLOOD GAS, ED
Acid-Base Excess: 1 mmol/L (ref 0.0–2.0)
Bicarbonate: 25.7 mmol/L (ref 20.0–28.0)
Calcium, Ion: 1.22 mmol/L (ref 1.15–1.40)
HCT: 39 % (ref 36.0–46.0)
Hemoglobin: 13.3 g/dL (ref 12.0–15.0)
O2 Saturation: 72 %
Patient temperature: 97.9
Potassium: 3.9 mmol/L (ref 3.5–5.1)
Sodium: 125 mmol/L — ABNORMAL LOW (ref 135–145)
TCO2: 27 mmol/L (ref 22–32)
pCO2, Ven: 40.6 mmHg — ABNORMAL LOW (ref 44–60)
pH, Ven: 7.407 (ref 7.25–7.43)
pO2, Ven: 37 mmHg (ref 32–45)

## 2022-04-01 LAB — CBG MONITORING, ED
Glucose-Capillary: 522 mg/dL (ref 70–99)
Glucose-Capillary: 475 mg/dL — ABNORMAL HIGH (ref 70–99)

## 2022-04-01 MED ORDER — LIVING WELL WITH DIABETES BOOK
Freq: Once | Status: AC
  Administered 2022-04-01: 1
  Filled 2022-04-01: qty 1

## 2022-04-01 MED ORDER — SODIUM CHLORIDE 0.9 % IV BOLUS
1000.0000 mL | Freq: Once | INTRAVENOUS | Status: AC
  Administered 2022-04-01: 1000 mL via INTRAVENOUS

## 2022-04-01 MED ORDER — INSULIN ASPART 100 UNIT/ML IJ SOLN
8.0000 [IU] | Freq: Once | INTRAMUSCULAR | Status: AC
  Administered 2022-04-01: 8 [IU] via SUBCUTANEOUS

## 2022-04-01 NOTE — ED Triage Notes (Signed)
Pt c/o weakness, fatigue, dry mouth, unintentional weight loss, blurred vision.  Pt reports had blood work done yesterday as requested by her PMD and was told this morning her glucose levels were "critical" and advised to followup in ED.

## 2022-04-01 NOTE — ED Notes (Signed)
Diabetes book given and understood.

## 2022-04-01 NOTE — Progress Notes (Addendum)
Inpatient Diabetes Program Recommendations  AACE/ADA: New Consensus Statement on Inpatient Glycemic Control (2015)  Target Ranges:  Prepandial:   less than 140 mg/dL      Peak postprandial:   less than 180 mg/dL (1-2 hours)      Critically ill patients:  140 - 180 mg/dL   Lab Results  Component Value Date   GLUCAP 475 (H) 04/01/2022    Review of Glycemic Control  Latest Reference Range & Units 04/01/22 11:47 04/01/22 14:07  Glucose-Capillary 70 - 99 mg/dL 235 (HH) 361 (H)   Diabetes history: DM 2 Outpatient Diabetes medications:  Metformin 500 mg bid Prednisone 20 mg daily (has stopped taking) Current orders for Inpatient glycemic control:  Received 8 units Novolog x1  Inpatient Diabetes Program Recommendations:    Spoke with patient regarding DM.  She states that she was started on Trulicity/Ozempic back in May/June and had a severe allergic reaction which caused her hands to be raw, blistered and peeling.  She has been on Prednisone off and on since June.   The last month or so, she has had dry mouth, thirst, blurred vision and fatigue and states that she has not "felt well".  She was called this morning with a "critical" glucose level.  She called her PCP but has not received a call back so she decided to come to the ED.  She is no longer taking the Prednisone.  We briefly discussed the symptoms of high blood sugars and compared to her current symptoms.  OF note she states that her A1C went from 6.4% up to 6.8% and this was when GLP was added.  She has a glucose meter at home but has not even opened due to her fingers/hands being raw.  She states that her hands are somewhat better and she should be able to test once she gets home.  Per MD, the plan is for her to get fluids and likely d/c home with Metformin and Low dose Sulfonylurea until she can be seen by PCP.   Ordered LWWD booket for patient for education and called her back to discuss monitoring and medications for DM. She needs  f/u next week with PCP.    Thanks,  Beryl Meager, RN, BC-ADM Inpatient Diabetes Coordinator Pager [phone]  ([phone]- Spoke with patient by phone again and discussed monitoring of blood sugars. We discussed importance of eliminating sugar from beverages (she states she has been drinking gatorade) including water, sugar free gatorade, etc.  We discussed medications and how the sulfonylureas/metformin work and encouraged her to take with food.  She states that she just started metformin back.  Encouraged her to let her PCP know that she has been in the ED and to call their office with any concerns throughout the weekend. Patient appreciative of call.  1505-  Per MD, patient has sulfa allergy therefore will NOT order Amaryl.  To be discharged home on Metformin and f/u with PCP.

## 2022-04-01 NOTE — ED Notes (Signed)
Pt given water and popcorn, per Dr. Charm Barges. Informed Josh - Medic.

## 2022-04-01 NOTE — Discharge Instructions (Addendum)
Your blood sugar was elevated here and you were given fluids and insulin with improvement in your sugar and symptoms.  Continue your metformin. Please continue to follow your blood sugars at home.  Return for any low blood sugar episodes that you are unable to control.  Follow-up with your primary care doctor.

## 2022-04-01 NOTE — ED Notes (Signed)
RT note: VBG obtained, RN aware, at bedside.

## 2022-04-01 NOTE — ED Provider Notes (Signed)
[facility] EMERGENCY DEPT Provider Note   CSN: [id number] Arrival date & time: 04/01/22  1132     History  Chief Complaint  Patient presents with   Hyperglycemia    Sandra Carpenter is a 46 y.o. female.  She is here with complaining of increased thirst increased urination and blurry vision that is been going on for over a month.  She had some sort of dermatologic problem going on with desquamation of her hands causing her to go on multiple courses of prednisone and most recently a retinol agent.  She thought the symptoms were side effects of this but they have been getting worse.  She finished prednisone just about a week ago.  The history is provided by the patient.  Hyperglycemia Blood sugar level PTA:  600 Severity:  Severe Onset quality:  Gradual Progression:  Unchanged Chronicity:  New Diabetes status:  Non-diabetic Current diabetic therapy:  Metformin - ?1 week Context: new diabetes diagnosis   Relieved by:  None tried Ineffective treatments:  None tried Associated symptoms: blurred vision, dehydration, fatigue, increased appetite, increased thirst and polyuria   Associated symptoms: no abdominal pain, no altered mental status and no fever   Risk factors: recent steroid use        Home Medications Prior to Admission medications   Medication Sig Start Date End Date Taking? Authorizing Provider  amLODipine (NORVASC) 5 MG tablet Take 5 mg by mouth daily.    [provider]  Fe Fum-FePoly-Vit C-Vit B3 (INTEGRA) 62.5-62.5-40-3 MG CAPS Take by mouth.    [provider]  ferrous sulfate 325 (65 FE) MG EC tablet Take 1 tablet (325 mg total) by mouth 3 (three) times daily with meals. 01/25/17 02/24/17  Daisy Blossom, MD  hydrochlorothiazide (HYDRODIURIL) 25 MG tablet hydrochlorothiazide 25 mg tabs Patient not taking: Reported on 03/29/2022    [provider]  ibuprofen (ADVIL,MOTRIN) 800 MG tablet ibuprofen 800 mg tablet  TAKE  1 TABLET EVERY 8 HOURS BY MOUTH    [provider]  linaclotide (LINZESS) 145 MCG CAPS capsule Take 145 mcg by mouth daily before breakfast.    [provider]  magic mouthwash (nystatin, lidocaine, diphenhydrAMINE) suspension Take 5 mLs by mouth 4 (four) times daily. 03/26/22   Carlisle Beers, FNP  metFORMIN (GLUCOPHAGE) 500 MG tablet Take 500 mg by mouth 2 (two) times daily. 03/19/22   [provider]  predniSONE (DELTASONE) 20 MG tablet Take by mouth.    [provider]  valACYclovir (VALTREX) 500 MG tablet valacyclovir 500 mg tablet  TAKE 1 TABLET BY MOUTH EVERY DAY    [provider]  valsartan-hydrochlorothiazide (DIOVAN-HCT) 160-12.5 MG tablet Take 1 tablet by mouth daily.    [provider]  fluticasone (FLONASE) 50 MCG/ACT nasal spray fluticasone 50 mcg/actuation nasal spray,suspension  2 SPRAYS IN EACH NOSTRIL ONCE A DAY IF NEEDED  10/26/18  [provider]  promethazine (PHENERGAN) 25 MG tablet promethazine 25 mg tablet  Take 1 tablet every 4 hours by oral route.  10/26/18  [provider]      Allergies    Penicillins, Sulfa antibiotics, Acitretin, Ozempic (0.25 or 0.5 mg-dose) [semaglutide(0.25 or 0.5mg -dos)], and Trulicity [dulaglutide]    Review of Systems   Review of Systems  Constitutional:  Positive for fatigue. Negative for fever.  Eyes:  Positive for blurred vision and visual disturbance.  Gastrointestinal:  Negative for abdominal pain.  Endocrine: Positive for polydipsia and polyuria.  Genitourinary:  Positive for frequency.  Skin:  Positive for rash.    Physical Exam Updated Vital Signs BP (!) 128/91 (BP Location: Left Arm)   Pulse 98   Temp 97.9 F (36.6 C) (Oral)   Resp 18   SpO2 99%  Physical Exam Vitals and nursing note reviewed.  Constitutional:      General: She is not in acute distress.    Appearance: Normal appearance. She is well-developed.  HENT:     Head: Normocephalic  and atraumatic.  Eyes:     Conjunctiva/sclera: Conjunctivae normal.  Cardiovascular:     Rate and Rhythm: Normal rate and regular rhythm.     Heart sounds: No murmur heard. Pulmonary:     Effort: Pulmonary effort is normal. No respiratory distress.     Breath sounds: Normal breath sounds.  Abdominal:     Palpations: Abdomen is soft.     Tenderness: There is no abdominal tenderness. There is no guarding or rebound.  Musculoskeletal:        General: Normal range of motion.     Cervical back: Neck supple.  Skin:    General: Skin is warm and dry.     Capillary Refill: Capillary refill takes less than 2 seconds.     Findings: Rash (she has some desquamation on her palms) present.  Neurological:     General: No focal deficit present.     Mental Status: She is alert.     ED Results / Procedures / Treatments   Labs (all labs ordered are listed, but only abnormal results are displayed) Labs Reviewed  BASIC METABOLIC PANEL - Abnormal; Notable for the following components:      Result Value   Sodium 127 (*)    Chloride 89 (*)    Glucose, Bld 611 (*)    BUN 29 (*)    Creatinine, Ser 1.59 (*)    GFR, Estimated 40 (*)    All other components within normal limits  URINALYSIS, ROUTINE W REFLEX MICROSCOPIC - Abnormal; Notable for the following components:   Glucose, UA >1,000 (*)    Ketones, ur 15 (*)    Protein, ur 30 (*)    All other components within normal limits  BETA-HYDROXYBUTYRIC ACID - Abnormal; Notable for the following components:   Beta-Hydroxybutyric Acid 1.26 (*)    All other components within normal limits  CBG MONITORING, ED - Abnormal; Notable for the following components:   Glucose-Capillary 522 (*)    All other components within normal limits  CBG MONITORING, ED - Abnormal; Notable for the following components:   Glucose-Capillary 475 (*)    All other components within normal limits  I-STAT VENOUS BLOOD GAS, ED - Abnormal; Notable for the following components:    pCO2, Ven 40.6 (*)    Sodium 125 (*)    All other components within normal limits  CBC  CBG MONITORING, ED    EKG None  Radiology No results found.  Procedures Procedures    Medications Ordered in ED Medications  sodium chloride 0.9 % bolus 1,000 mL (0 mLs Intravenous Stopped 04/01/22 1409)  insulin aspart (novoLOG) injection 8 Units (8 Units Subcutaneous Given 04/01/22 1257)  living well with diabetes book MISC (1 each Does not apply Given 04/01/22 1508)  sodium chloride 0.9 % bolus 1,000 mL (0 mLs Intravenous Stopped 04/01/22 1620)    ED Course/ Medical Decision Making/ A&P Clinical Course as of 04/01/22 1709  Fri Apr 01, 2022  1457 Diabetic coordinator involved and recommending starting patient on Amaryl 2mg   daily for 5 days and to follow her blood sugar at home.  Patient feeling improved and agreeable to plan.  No evidence of DKA. [MB]  1506 Action as patient has a sulfa allergy.  Reviewed with diabetic coordinator and they said just have her continue the metformin. [MB]    Clinical Course User Index [MB] Terrilee Files, MD                           Medical Decision Making Amount and/or Complexity of Data Reviewed Labs: ordered.  Risk Prescription drug management.   This patient complains of elevated blood sugar polyuria polydipsia blurry vision; this involves an extensive number of treatment Options and is a complaint that carries with it a high risk of complications and morbidity. The differential includes hyperglycemia, diabetes, DKA, dehydration, metabolic derangement  I ordered, reviewed and interpreted labs, which included CBC with normal white count normal hemoglobin, chemistries with normal Elevated glucose elevated creatinine, urinalysis with minimal ketones, VBG with normal pH I ordered medication IV fluids and subcu insulin and reviewed PMP when indicated. Additional history obtained from patient's husband Previous records obtained and reviewed in  epic including recent PCP visits I consulted diabetes educator and discussed lab and imaging findings and discussed disposition.  Cardiac monitoring reviewed, normal sinus rhythm Social determinants considered, no significant barriers Critical Interventions: None  After the interventions stated above, I reevaluated the patient and found patient to be well-appearing in no distress Admission and further testing considered, no indications for admission at this time.  She will be closely followed up with PCP.  Return instructions discussed         Final Clinical Impression(s) / ED Diagnoses Final diagnoses:  Hyperglycemia    Rx / DC Orders ED Discharge Orders     None         Terrilee Files, MD 04/01/22 1711

## 2022-04-04 DIAGNOSIS — H16223 Keratoconjunctivitis sicca, not specified as Sjogren's, bilateral: Secondary | ICD-10-CM | POA: Diagnosis not present

## 2022-04-05 DIAGNOSIS — Z5181 Encounter for therapeutic drug level monitoring: Secondary | ICD-10-CM | POA: Diagnosis not present

## 2022-04-05 DIAGNOSIS — L409 Psoriasis, unspecified: Secondary | ICD-10-CM | POA: Diagnosis not present

## 2022-04-05 DIAGNOSIS — E119 Type 2 diabetes mellitus without complications: Secondary | ICD-10-CM | POA: Diagnosis not present

## 2022-04-20 DIAGNOSIS — E119 Type 2 diabetes mellitus without complications: Secondary | ICD-10-CM | POA: Diagnosis not present

## 2022-04-20 DIAGNOSIS — R7309 Other abnormal glucose: Secondary | ICD-10-CM | POA: Diagnosis not present

## 2022-04-25 DIAGNOSIS — H16223 Keratoconjunctivitis sicca, not specified as Sjogren's, bilateral: Secondary | ICD-10-CM | POA: Diagnosis not present

## 2022-05-02 DIAGNOSIS — Z5181 Encounter for therapeutic drug level monitoring: Secondary | ICD-10-CM | POA: Diagnosis not present

## 2022-05-02 DIAGNOSIS — L409 Psoriasis, unspecified: Secondary | ICD-10-CM | POA: Diagnosis not present

## 2022-05-23 DIAGNOSIS — R21 Rash and other nonspecific skin eruption: Secondary | ICD-10-CM | POA: Diagnosis not present

## 2022-05-23 DIAGNOSIS — L403 Pustulosis palmaris et plantaris: Secondary | ICD-10-CM | POA: Diagnosis not present

## 2022-05-23 DIAGNOSIS — R682 Dry mouth, unspecified: Secondary | ICD-10-CM | POA: Diagnosis not present

## 2022-05-23 DIAGNOSIS — M2560 Stiffness of unspecified joint, not elsewhere classified: Secondary | ICD-10-CM | POA: Diagnosis not present

## 2022-05-23 DIAGNOSIS — R700 Elevated erythrocyte sedimentation rate: Secondary | ICD-10-CM | POA: Diagnosis not present

## 2022-05-24 IMAGING — MG MM DIGITAL DIAGNOSTIC UNILAT*L* W/ TOMO W/ CAD
4 series · 4 of 12 positions shown · non-contrast
Comparison: Previous exam(s).

CLINICAL DATA: Screening recall for a left breast asymmetry.

EXAM:
DIGITAL DIAGNOSTIC UNILATERAL LEFT MAMMOGRAM WITH TOMOSYNTHESIS AND
CAD
TECHNIQUE: Left digital diagnostic mammography and breast tomosynthesis was
performed. The images were evaluated with computer-aided detection.

[L ML synth-2D]
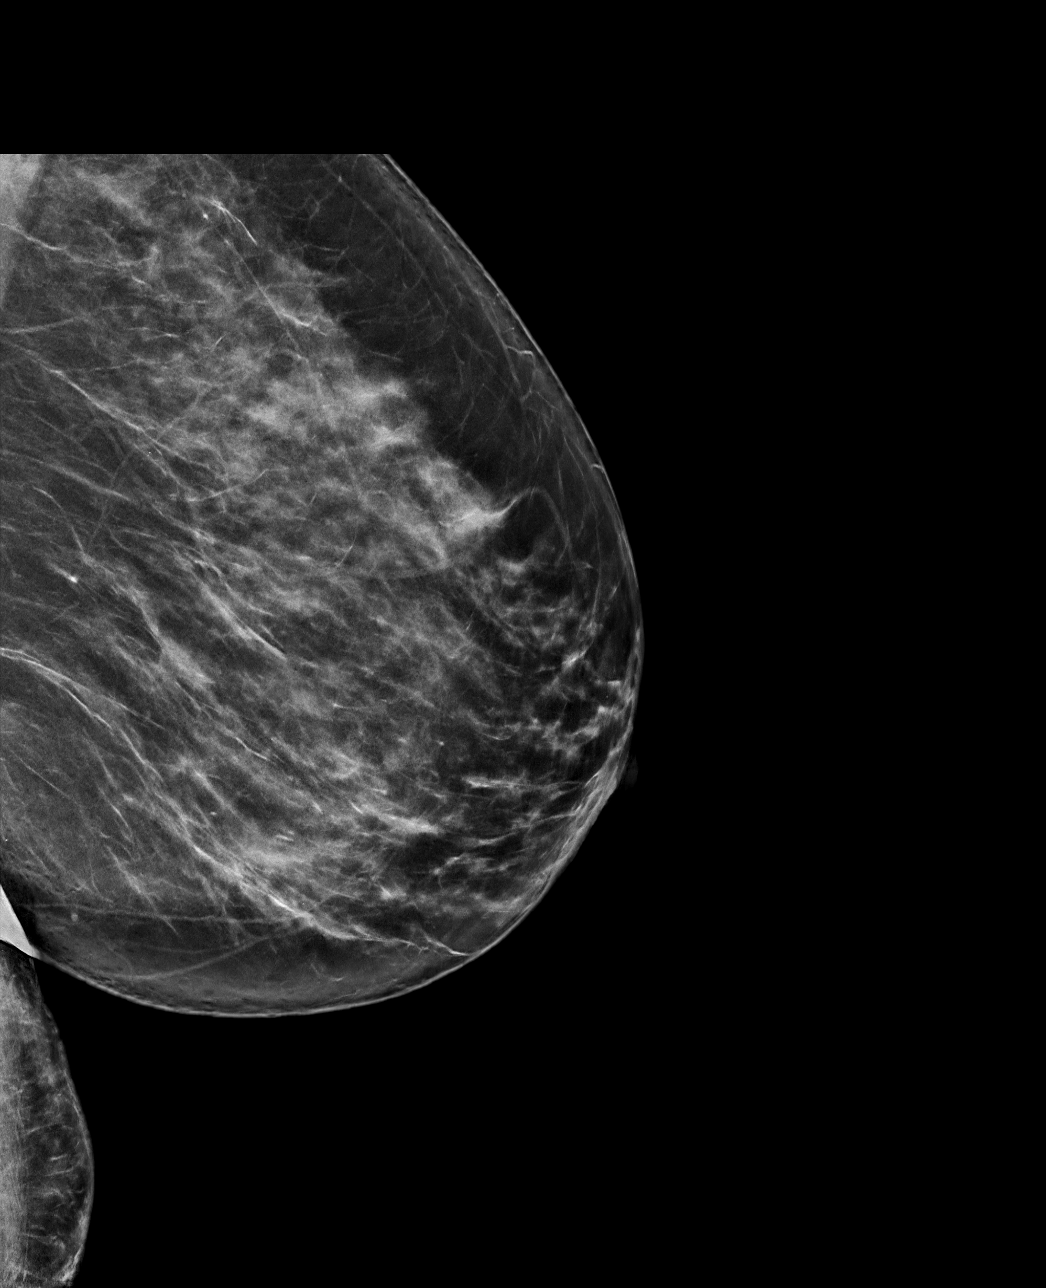

[L MLO synth-2D]
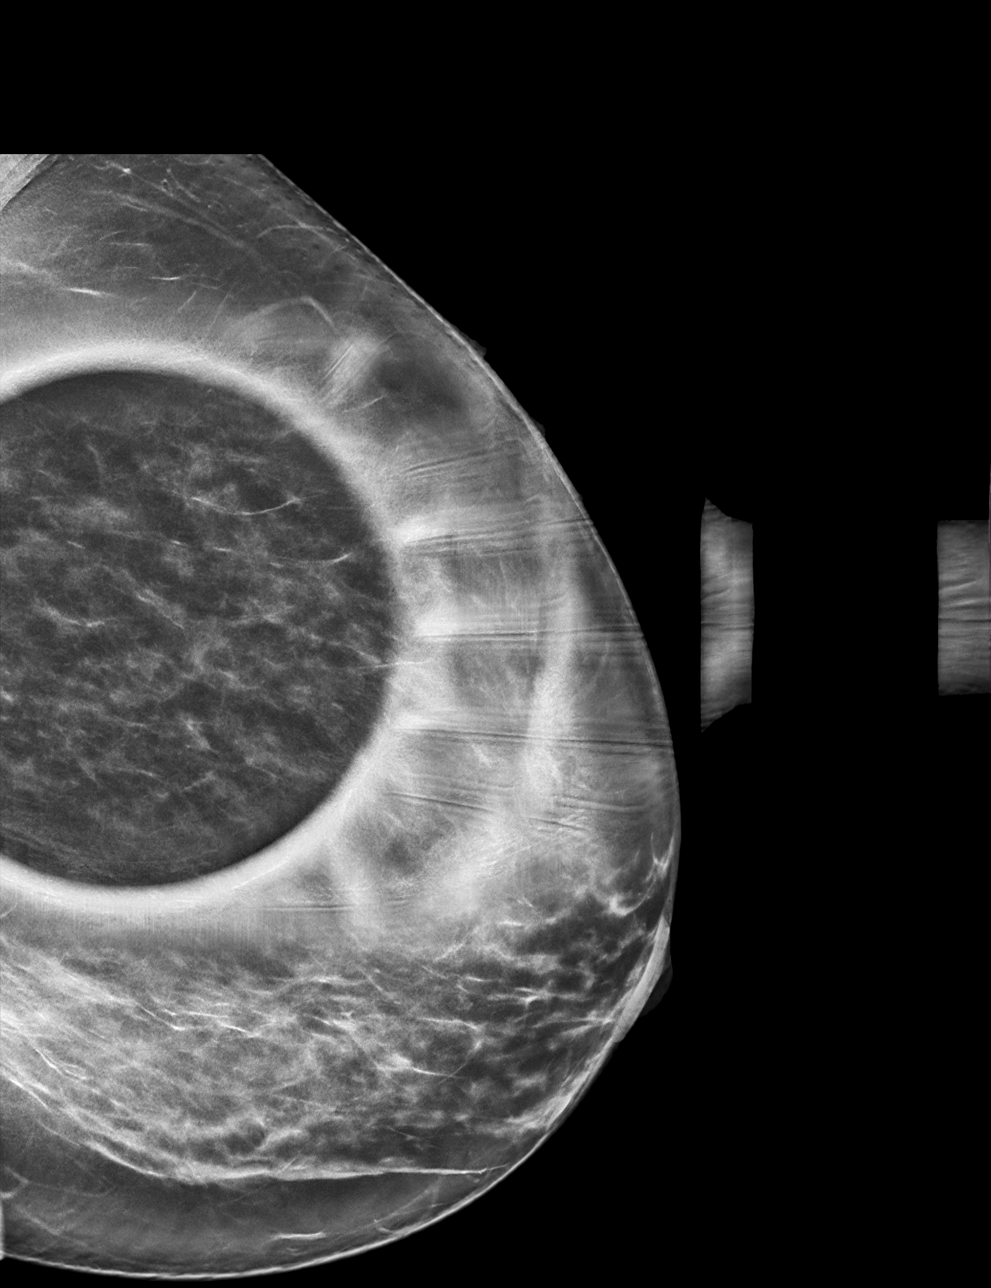

[L ML tomo · tomo slice 41/81.0]
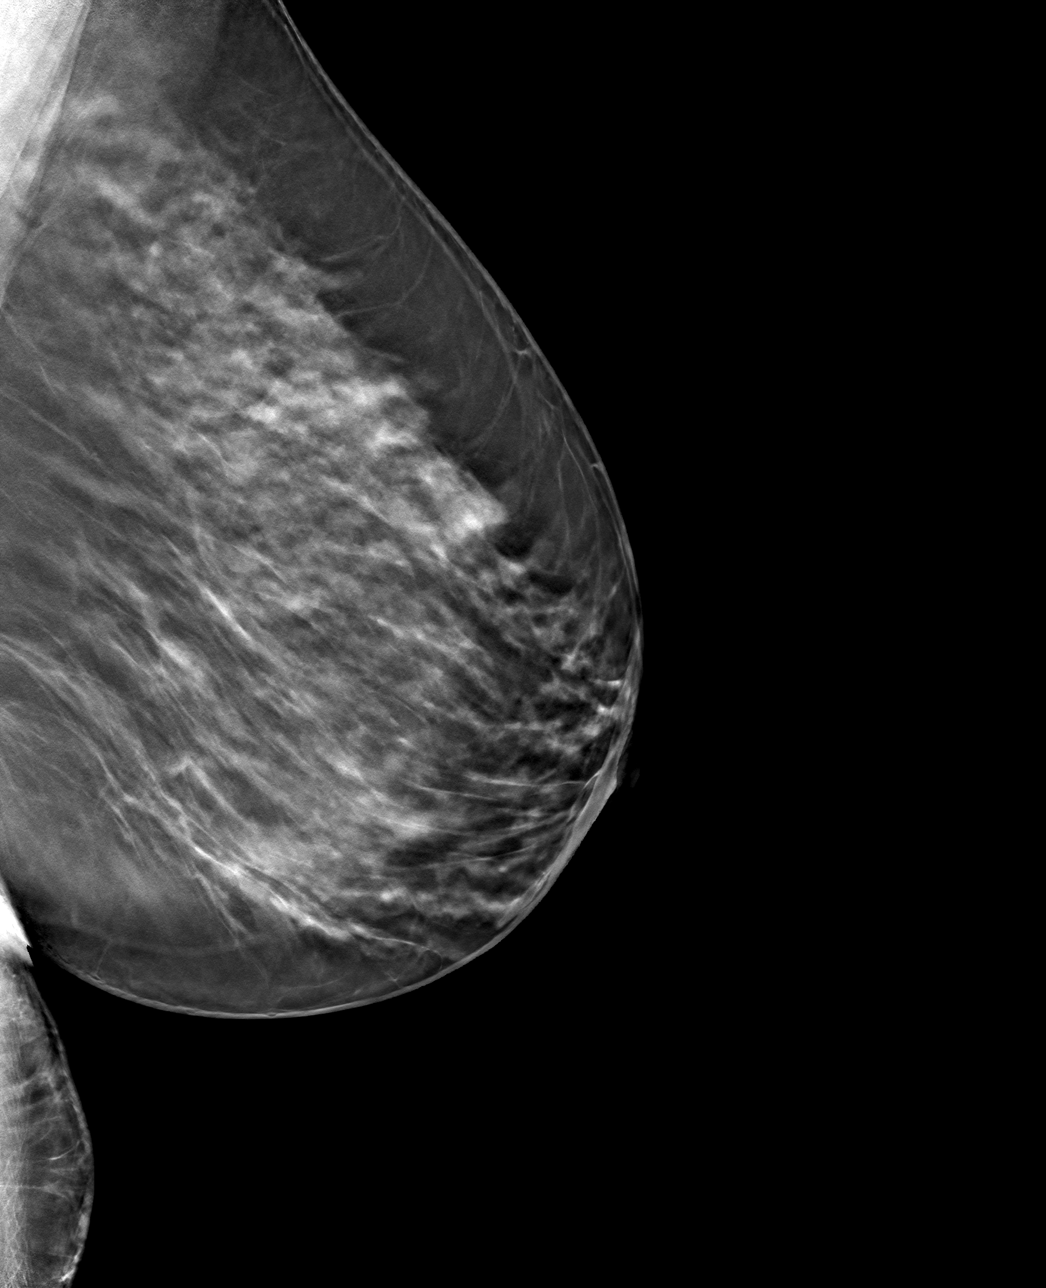

[L MLO tomo · tomo slice 36/71.0]
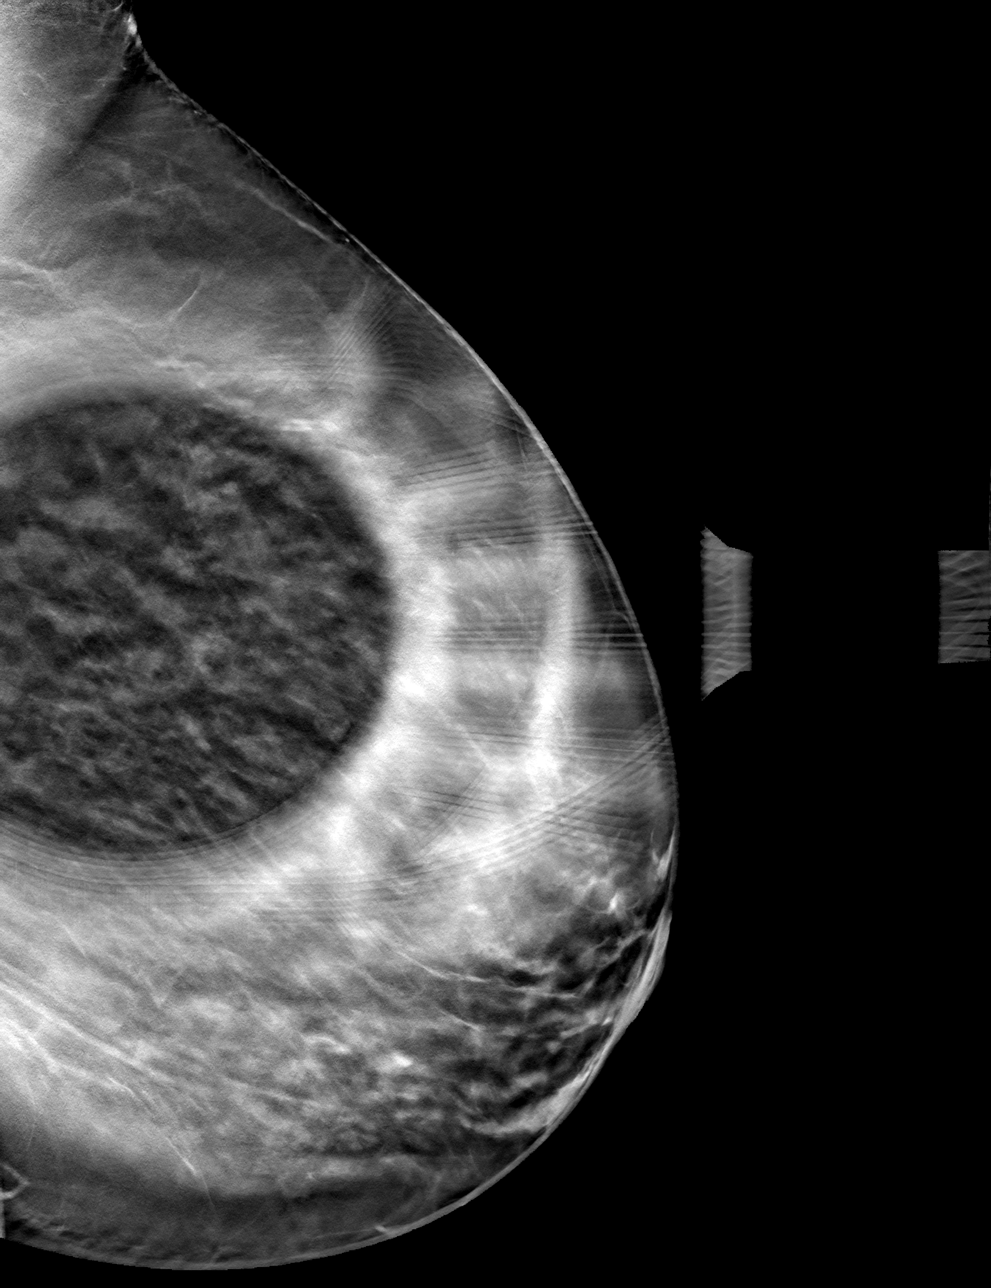

[4 of 12 positions shown; findings below may reference images not displayed]

ACR Breast Density Category c: The breast tissue is heterogeneously
dense, which may obscure small masses.
FINDINGS: The asymmetry in the central left breast on the MLO view resolves
with spot compression tomosynthesis imaging consistent with
overlapping fibroglandular tissue. No suspicious calcifications,
masses or areas of distortion are seen in the left breast.
IMPRESSION: Resolution of the left breast asymmetry consistent with overlapping
fibroglandular tissue.

RECOMMENDATION:
Screening mammogram in one year.(Code:MD-6-ECB)

I have discussed the findings and recommendations with the patient.
If applicable, a reminder letter will be sent to the patient
regarding the next appointment.

BI-RADS CATEGORY  1: Negative.

## 2022-06-03 DIAGNOSIS — L409 Psoriasis, unspecified: Secondary | ICD-10-CM | POA: Diagnosis not present

## 2022-06-07 DIAGNOSIS — R682 Dry mouth, unspecified: Secondary | ICD-10-CM | POA: Diagnosis not present

## 2022-06-07 DIAGNOSIS — R700 Elevated erythrocyte sedimentation rate: Secondary | ICD-10-CM | POA: Diagnosis not present

## 2022-06-07 DIAGNOSIS — R21 Rash and other nonspecific skin eruption: Secondary | ICD-10-CM | POA: Diagnosis not present

## 2022-06-07 DIAGNOSIS — R809 Proteinuria, unspecified: Secondary | ICD-10-CM | POA: Diagnosis not present

## 2022-06-07 DIAGNOSIS — L403 Pustulosis palmaris et plantaris: Secondary | ICD-10-CM | POA: Diagnosis not present

## 2022-06-07 DIAGNOSIS — D8989 Other specified disorders involving the immune mechanism, not elsewhere classified: Secondary | ICD-10-CM | POA: Diagnosis not present

## 2022-06-15 DIAGNOSIS — Z862 Personal history of diseases of the blood and blood-forming organs and certain disorders involving the immune mechanism: Secondary | ICD-10-CM | POA: Diagnosis not present

## 2022-06-15 DIAGNOSIS — N182 Chronic kidney disease, stage 2 (mild): Secondary | ICD-10-CM | POA: Diagnosis not present

## 2022-06-15 DIAGNOSIS — I10 Essential (primary) hypertension: Secondary | ICD-10-CM | POA: Diagnosis not present

## 2022-07-01 DIAGNOSIS — E1169 Type 2 diabetes mellitus with other specified complication: Secondary | ICD-10-CM | POA: Diagnosis not present

## 2022-07-01 DIAGNOSIS — Z23 Encounter for immunization: Secondary | ICD-10-CM | POA: Diagnosis not present

## 2022-07-01 DIAGNOSIS — I10 Essential (primary) hypertension: Secondary | ICD-10-CM | POA: Diagnosis not present

## 2022-07-01 DIAGNOSIS — Z0000 Encounter for general adult medical examination without abnormal findings: Secondary | ICD-10-CM | POA: Diagnosis not present

## 2022-07-01 DIAGNOSIS — D509 Iron deficiency anemia, unspecified: Secondary | ICD-10-CM | POA: Diagnosis not present

## 2022-07-01 DIAGNOSIS — Z13220 Encounter for screening for lipoid disorders: Secondary | ICD-10-CM | POA: Diagnosis not present

## 2022-08-06 DIAGNOSIS — Z03818 Encounter for observation for suspected exposure to other biological agents ruled out: Secondary | ICD-10-CM | POA: Diagnosis not present

## 2022-08-06 DIAGNOSIS — J209 Acute bronchitis, unspecified: Secondary | ICD-10-CM | POA: Diagnosis not present

## 2022-08-06 DIAGNOSIS — R051 Acute cough: Secondary | ICD-10-CM | POA: Diagnosis not present

## 2022-08-11 DIAGNOSIS — Z01419 Encounter for gynecological examination (general) (routine) without abnormal findings: Secondary | ICD-10-CM | POA: Diagnosis not present

## 2022-08-11 DIAGNOSIS — N898 Other specified noninflammatory disorders of vagina: Secondary | ICD-10-CM | POA: Diagnosis not present

## 2022-08-11 DIAGNOSIS — Z1231 Encounter for screening mammogram for malignant neoplasm of breast: Secondary | ICD-10-CM | POA: Diagnosis not present

## 2022-08-18 DIAGNOSIS — R21 Rash and other nonspecific skin eruption: Secondary | ICD-10-CM | POA: Diagnosis not present

## 2022-08-18 DIAGNOSIS — Z5181 Encounter for therapeutic drug level monitoring: Secondary | ICD-10-CM | POA: Diagnosis not present

## 2022-08-18 DIAGNOSIS — L409 Psoriasis, unspecified: Secondary | ICD-10-CM | POA: Diagnosis not present

## 2022-08-18 DIAGNOSIS — L739 Follicular disorder, unspecified: Secondary | ICD-10-CM | POA: Diagnosis not present

## 2022-08-18 DIAGNOSIS — L819 Disorder of pigmentation, unspecified: Secondary | ICD-10-CM | POA: Diagnosis not present

## 2022-09-14 DIAGNOSIS — N182 Chronic kidney disease, stage 2 (mild): Secondary | ICD-10-CM | POA: Diagnosis not present

## 2022-09-14 DIAGNOSIS — Z862 Personal history of diseases of the blood and blood-forming organs and certain disorders involving the immune mechanism: Secondary | ICD-10-CM | POA: Diagnosis not present

## 2022-09-14 DIAGNOSIS — R799 Abnormal finding of blood chemistry, unspecified: Secondary | ICD-10-CM | POA: Diagnosis not present

## 2022-09-14 DIAGNOSIS — I129 Hypertensive chronic kidney disease with stage 1 through stage 4 chronic kidney disease, or unspecified chronic kidney disease: Secondary | ICD-10-CM | POA: Diagnosis not present

## 2022-09-14 DIAGNOSIS — E559 Vitamin D deficiency, unspecified: Secondary | ICD-10-CM | POA: Diagnosis not present

## 2022-11-22 DIAGNOSIS — Z5181 Encounter for therapeutic drug level monitoring: Secondary | ICD-10-CM | POA: Diagnosis not present

## 2022-12-01 DIAGNOSIS — L403 Pustulosis palmaris et plantaris: Secondary | ICD-10-CM | POA: Diagnosis not present

## 2022-12-01 DIAGNOSIS — L301 Dyshidrosis [pompholyx]: Secondary | ICD-10-CM | POA: Diagnosis not present

## 2022-12-06 DIAGNOSIS — Z03818 Encounter for observation for suspected exposure to other biological agents ruled out: Secondary | ICD-10-CM | POA: Diagnosis not present

## 2022-12-06 DIAGNOSIS — H9203 Otalgia, bilateral: Secondary | ICD-10-CM | POA: Diagnosis not present

## 2022-12-06 DIAGNOSIS — J020 Streptococcal pharyngitis: Secondary | ICD-10-CM | POA: Diagnosis not present

## 2022-12-06 DIAGNOSIS — R059 Cough, unspecified: Secondary | ICD-10-CM | POA: Diagnosis not present

## 2022-12-12 DIAGNOSIS — I10 Essential (primary) hypertension: Secondary | ICD-10-CM | POA: Diagnosis not present

## 2022-12-12 DIAGNOSIS — E119 Type 2 diabetes mellitus without complications: Secondary | ICD-10-CM | POA: Diagnosis not present

## 2022-12-12 DIAGNOSIS — E559 Vitamin D deficiency, unspecified: Secondary | ICD-10-CM | POA: Diagnosis not present

## 2022-12-12 DIAGNOSIS — E538 Deficiency of other specified B group vitamins: Secondary | ICD-10-CM | POA: Diagnosis not present

## 2022-12-12 DIAGNOSIS — R300 Dysuria: Secondary | ICD-10-CM | POA: Diagnosis not present

## 2022-12-12 DIAGNOSIS — D509 Iron deficiency anemia, unspecified: Secondary | ICD-10-CM | POA: Diagnosis not present

## 2022-12-30 DIAGNOSIS — L209 Atopic dermatitis, unspecified: Secondary | ICD-10-CM | POA: Diagnosis not present

## 2022-12-30 DIAGNOSIS — L409 Psoriasis, unspecified: Secondary | ICD-10-CM | POA: Diagnosis not present

## 2023-01-25 DIAGNOSIS — I129 Hypertensive chronic kidney disease with stage 1 through stage 4 chronic kidney disease, or unspecified chronic kidney disease: Secondary | ICD-10-CM | POA: Diagnosis not present

## 2023-01-25 DIAGNOSIS — E559 Vitamin D deficiency, unspecified: Secondary | ICD-10-CM | POA: Diagnosis not present

## 2023-01-25 DIAGNOSIS — N182 Chronic kidney disease, stage 2 (mild): Secondary | ICD-10-CM | POA: Diagnosis not present

## 2023-02-17 DIAGNOSIS — N182 Chronic kidney disease, stage 2 (mild): Secondary | ICD-10-CM | POA: Diagnosis not present

## 2023-03-24 DIAGNOSIS — N182 Chronic kidney disease, stage 2 (mild): Secondary | ICD-10-CM | POA: Diagnosis not present

## 2023-04-11 DIAGNOSIS — E119 Type 2 diabetes mellitus without complications: Secondary | ICD-10-CM | POA: Diagnosis not present

## 2023-04-11 DIAGNOSIS — J988 Other specified respiratory disorders: Secondary | ICD-10-CM | POA: Diagnosis not present

## 2023-04-11 DIAGNOSIS — I10 Essential (primary) hypertension: Secondary | ICD-10-CM | POA: Diagnosis not present

## 2023-04-11 DIAGNOSIS — M2550 Pain in unspecified joint: Secondary | ICD-10-CM | POA: Diagnosis not present

## 2023-04-11 DIAGNOSIS — L409 Psoriasis, unspecified: Secondary | ICD-10-CM | POA: Diagnosis not present

## 2023-04-11 DIAGNOSIS — Z23 Encounter for immunization: Secondary | ICD-10-CM | POA: Diagnosis not present

## 2023-04-12 DIAGNOSIS — N182 Chronic kidney disease, stage 2 (mild): Secondary | ICD-10-CM | POA: Diagnosis not present

## 2023-05-03 DIAGNOSIS — E119 Type 2 diabetes mellitus without complications: Secondary | ICD-10-CM | POA: Diagnosis not present

## 2023-05-26 DIAGNOSIS — M5416 Radiculopathy, lumbar region: Secondary | ICD-10-CM | POA: Diagnosis not present

## 2023-05-26 DIAGNOSIS — M25561 Pain in right knee: Secondary | ICD-10-CM | POA: Diagnosis not present

## 2023-05-26 DIAGNOSIS — M25562 Pain in left knee: Secondary | ICD-10-CM | POA: Diagnosis not present

## 2023-06-02 DIAGNOSIS — M545 Low back pain, unspecified: Secondary | ICD-10-CM | POA: Diagnosis not present

## 2023-06-16 DIAGNOSIS — M25561 Pain in right knee: Secondary | ICD-10-CM | POA: Diagnosis not present

## 2023-06-16 DIAGNOSIS — M5416 Radiculopathy, lumbar region: Secondary | ICD-10-CM | POA: Diagnosis not present

## 2023-07-07 DIAGNOSIS — M5416 Radiculopathy, lumbar region: Secondary | ICD-10-CM | POA: Diagnosis not present

## 2023-07-25 DIAGNOSIS — M5416 Radiculopathy, lumbar region: Secondary | ICD-10-CM | POA: Diagnosis not present

## 2023-07-25 DIAGNOSIS — N1831 Chronic kidney disease, stage 3a: Secondary | ICD-10-CM | POA: Diagnosis not present

## 2023-07-25 DIAGNOSIS — I129 Hypertensive chronic kidney disease with stage 1 through stage 4 chronic kidney disease, or unspecified chronic kidney disease: Secondary | ICD-10-CM | POA: Diagnosis not present

## 2023-07-25 DIAGNOSIS — E559 Vitamin D deficiency, unspecified: Secondary | ICD-10-CM | POA: Diagnosis not present

## 2023-07-28 DIAGNOSIS — Z0000 Encounter for general adult medical examination without abnormal findings: Secondary | ICD-10-CM | POA: Diagnosis not present

## 2023-07-28 DIAGNOSIS — D509 Iron deficiency anemia, unspecified: Secondary | ICD-10-CM | POA: Diagnosis not present

## 2023-07-28 DIAGNOSIS — Z23 Encounter for immunization: Secondary | ICD-10-CM | POA: Diagnosis not present

## 2023-07-28 DIAGNOSIS — E1169 Type 2 diabetes mellitus with other specified complication: Secondary | ICD-10-CM | POA: Diagnosis not present

## 2023-07-28 DIAGNOSIS — I10 Essential (primary) hypertension: Secondary | ICD-10-CM | POA: Diagnosis not present

## 2023-08-09 ENCOUNTER — Ambulatory Visit
Admission: RE | Admit: 2023-08-09 | Discharge: 2023-08-09 | Disposition: A | Discharge status: Home / Self Care | Source: Ambulatory Visit

## 2023-08-09 VITALS — BP 118/81 | HR 86 | Temp 98.4°F | Resp 16

## 2023-08-09 DIAGNOSIS — J069 Acute upper respiratory infection, unspecified: Secondary | ICD-10-CM | POA: Diagnosis not present

## 2023-08-09 DIAGNOSIS — J029 Acute pharyngitis, unspecified: Secondary | ICD-10-CM

## 2023-08-09 DIAGNOSIS — R059 Cough, unspecified: Secondary | ICD-10-CM

## 2023-08-09 MED ORDER — AZITHROMYCIN 250 MG PO TABS: 250.0000 mg | ORAL_TABLET | Freq: Every day | ORAL | 0 refills | Status: AC

## 2023-08-09 MED ORDER — PROMETHAZINE-DM 6.25-15 MG/5ML PO SYRP: 5.0000 mL | ORAL_SOLUTION | Freq: Two times a day (BID) | ORAL | 0 refills | Status: AC | PRN

## 2023-08-09 MED ORDER — PREDNISONE 20 MG PO TABS: ORAL_TABLET | ORAL | 0 refills | Status: AC

## 2023-08-09 NOTE — ED Triage Notes (Deleted)
 Pt presents to [facility] with co sore throat, chest congestion, thick yellow mucous, body aches since Sunday. Pt reports she has been taking nyquill and dayquill and hot tea.

## 2023-08-09 NOTE — ED Provider Notes (Signed)
 [facility]    CSN: [id number] Arrival date & time: 08/09/23  1704      History   Chief Complaint Chief Complaint  Patient presents with   Cough   Sore Throat   Generalized Body Aches    HPI Sandra Carpenter is a 48 y.o. female.   HPI 48 year old female presents with sore throat, chest congestion, cough, and bodyaches for 3 days.  PMH significant for obesity, CKD and HTN.  Past Medical History:  Diagnosis Date   Chronic kidney disease    Hypertension    Pyelonephritis     Patient Active Problem List   Diagnosis Date Noted   Rash/peeling skin 03/29/2022   Microcytic anemia 12/27/2016   Pyelonephritis     Past Surgical History:  Procedure Laterality Date   CESAREAN SECTION     TUBAL LIGATION      OB History   No obstetric history on file.      Home Medications    Prior to Admission medications   Medication Sig Start Date End Date Taking? Authorizing Provider  azithromycin (ZITHROMAX) 250 MG tablet Take 1 tablet (250 mg total) by mouth daily. Take first 2 tablets together, then 1 every day until finished. 08/09/23  Yes Trevor Iha, FNP  predniSONE (DELTASONE) 20 MG tablet Take 3 tabs PO daily x 5 days. 08/09/23  Yes Trevor Iha, FNP  promethazine-dextromethorphan (PROMETHAZINE-DM) 6.25-15 MG/5ML syrup Take 5 mLs by mouth 2 (two) times daily as needed for cough. 08/09/23  Yes Trevor Iha, FNP  amLODipine (NORVASC) 5 MG tablet Take 5 mg by mouth daily.    [provider]  FARXIGA 10 MG TABS tablet Take 10 mg by mouth daily.    [provider]  Fe Fum-FePoly-Vit C-Vit B3 (INTEGRA) 62.5-62.5-40-3 MG CAPS Take by mouth.    [provider]  ferrous sulfate 325 (65 FE) MG EC tablet Take 1 tablet (325 mg total) by mouth 3 (three) times daily with meals. 01/25/17 02/24/17  Daisy Blossom, MD  hydrochlorothiazide (HYDRODIURIL) 25 MG tablet hydrochlorothiazide 25 mg tabs Patient not taking: Reported on 03/29/2022     [provider]  ibuprofen (ADVIL,MOTRIN) 800 MG tablet ibuprofen 800 mg tablet  TAKE 1 TABLET EVERY 8 HOURS BY MOUTH    [provider]  metFORMIN (GLUCOPHAGE) 500 MG tablet Take 500 mg by mouth 2 (two) times daily. 03/19/22   [provider]  valACYclovir (VALTREX) 500 MG tablet valacyclovir 500 mg tablet  TAKE 1 TABLET BY MOUTH EVERY DAY    [provider]  valsartan-hydrochlorothiazide (DIOVAN-HCT) 160-12.5 MG tablet Take 1 tablet by mouth daily.    [provider]  fluticasone (FLONASE) 50 MCG/ACT nasal spray fluticasone 50 mcg/actuation nasal spray,suspension  2 SPRAYS IN EACH NOSTRIL ONCE A DAY IF NEEDED  10/26/18  [provider]  promethazine (PHENERGAN) 25 MG tablet promethazine 25 mg tablet  Take 1 tablet every 4 hours by oral route.  10/26/18  [provider]    Family History Family History  Problem Relation Age of Onset   Heart failure Mother    Hypertension Mother    Hypertension Father    Eczema Son     Social History Social History   Tobacco Use   Smoking status: Never   Smokeless tobacco: Never  Vaping Use   Vaping status: Never Used  Substance Use Topics   Alcohol use: Yes    Comment: occassional    Drug use: No  Allergies   Penicillins, Sulfa antibiotics, Acitretin, Ozempic (0.25 or 0.5 mg-dose) [semaglutide(0.25 or 0.5mg -dos)], and Trulicity [dulaglutide]   Review of Systems Review of Systems  HENT:  Positive for congestion and sore throat.   Respiratory:  Positive for cough.   All other systems reviewed and are negative.    Physical Exam Triage Vital Signs ED Triage Vitals  Encounter Vitals Group     BP 08/09/23 1737 118/81     Systolic BP Percentile --      Diastolic BP Percentile --      Pulse Rate 08/09/23 1737 86     Resp 08/09/23 1737 16     Temp 08/09/23 1737 98.4 F (36.9 C)     Temp src --      SpO2 08/09/23 1737 98 %     Weight --      Height --      Head  Circumference --      Peak Flow --      Pain Score 08/09/23 1734 5     Pain Loc --      Pain Education --      Exclude from Growth Chart --    No data found.  Updated Vital Signs BP 118/81   Pulse 86   Temp 98.4 F (36.9 C)   Resp 16   SpO2 98%   Physical Exam Vitals and nursing note reviewed.  Constitutional:      Appearance: Normal appearance. She is normal weight. She is ill-appearing.  HENT:     Head: Normocephalic and atraumatic.     Right Ear: Tympanic membrane, ear canal and external ear normal.     Left Ear: Tympanic membrane, ear canal and external ear normal.     Mouth/Throat:     Mouth: Mucous membranes are moist.     Pharynx: Oropharynx is clear. Uvula midline. Posterior oropharyngeal erythema present.  Eyes:     Extraocular Movements: Extraocular movements intact.     Conjunctiva/sclera: Conjunctivae normal.     Pupils: Pupils are equal, round, and reactive to light.  Cardiovascular:     Rate and Rhythm: Normal rate and regular rhythm.     Pulses: Normal pulses.     Heart sounds: Normal heart sounds.  Pulmonary:     Effort: Pulmonary effort is normal.     Breath sounds: No wheezing, rhonchi or rales.     Comments: Infrequent nonproductive cough on exam Musculoskeletal:        General: Normal range of motion.     Cervical back: Normal range of motion and neck supple.  Skin:    General: Skin is warm and dry.  Neurological:     General: No focal deficit present.     Mental Status: She is alert and oriented to person, place, and time. Mental status is at baseline.  Psychiatric:        Mood and Affect: Mood normal.        Behavior: Behavior normal.      UC Treatments / Results  Labs (all labs ordered are listed, but only abnormal results are displayed) Labs Reviewed - No data to display  EKG   Radiology No results found.  Procedures Procedures (including critical care time)  Medications Ordered in UC Medications - No data to  display  Initial Impression / Assessment and Plan / UC Course  I have reviewed the triage vital signs and the nursing notes.  Pertinent labs & imaging results that were available during my  care of the patient were reviewed by me and considered in my medical decision making (see chart for details).     MDM: 1.  Acute URI-Rx Zithromax 500 mg day 1, then 250 mg day 2-5; 2.  Cough, unspecified type-Rx'd Promethazine DM 6.25-15 Mg/5 mL syrup: Take 5 mL every 6 hours, as needed for cough; 3.  Sore throat-Rx prednisone 20 mg tablet: Take 3 tablets p.o. daily x 5 days. Advised patient to take medications as directed with food to completion.  Advised patient to take prednisone with Zithromax daily for the next 5 days.  Advised may use Promethazine DM cough syrup at night for cough prior to sleep due to sedative effects.  Encouraged to increase daily water intake to 64 ounces per day while taking these medications.  Advised if symptoms worsen and/or unresolved please follow-up with your PCP or here for further evaluation.  Patient discharged home, hemodynamically stable. Final Clinical Impressions(s) / UC Diagnoses   Final diagnoses:  Acute URI  Cough, unspecified type  Sore throat     Discharge Instructions      Advised patient to take medications as directed with food to completion.  Advised patient to take prednisone with Zithromax daily for the next 5 days.  Advised may use Promethazine DM cough syrup at night for cough prior to sleep due to sedative effects.  Encouraged to increase daily water intake to 64 ounces per day while taking these medications.  Advised if symptoms worsen and/or unresolved please follow-up with your PCP or here for further evaluation.     ED Prescriptions     Medication Sig Dispense Auth. Provider   azithromycin (ZITHROMAX) 250 MG tablet Take 1 tablet (250 mg total) by mouth daily. Take first 2 tablets together, then 1 every day until finished. 6 tablet Trevor Iha, FNP   predniSONE (DELTASONE) 20 MG tablet Take 3 tabs PO daily x 5 days. 15 tablet Trevor Iha, FNP   promethazine-dextromethorphan (PROMETHAZINE-DM) 6.25-15 MG/5ML syrup Take 5 mLs by mouth 2 (two) times daily as needed for cough. 118 mL Trevor Iha, FNP      PDMP not reviewed this encounter.   Trevor Iha, FNP 08/09/23 [phone]

## 2023-08-09 NOTE — ED Triage Notes (Deleted)
 Emtered in error

## 2023-08-09 NOTE — Discharge Instructions (Addendum)
 Advised patient to take medications as directed with food to completion.  Advised patient to take prednisone with Zithromax daily for the next 5 days.  Advised may use Promethazine DM cough syrup at night for cough prior to sleep due to sedative effects.  Encouraged to increase daily water intake to 64 ounces per day while taking these medications.  Advised if symptoms worsen and/or unresolved please follow-up with your PCP or here for further evaluation.

## 2023-08-17 DIAGNOSIS — Z1231 Encounter for screening mammogram for malignant neoplasm of breast: Secondary | ICD-10-CM | POA: Diagnosis not present

## 2023-08-21 DIAGNOSIS — N182 Chronic kidney disease, stage 2 (mild): Secondary | ICD-10-CM | POA: Diagnosis not present

## 2023-08-21 DIAGNOSIS — Z1389 Encounter for screening for other disorder: Secondary | ICD-10-CM | POA: Diagnosis not present

## 2023-08-21 DIAGNOSIS — I10 Essential (primary) hypertension: Secondary | ICD-10-CM | POA: Diagnosis not present

## 2023-08-21 DIAGNOSIS — E118 Type 2 diabetes mellitus with unspecified complications: Secondary | ICD-10-CM | POA: Diagnosis not present

## 2023-08-21 DIAGNOSIS — E611 Iron deficiency: Secondary | ICD-10-CM | POA: Diagnosis not present

## 2023-08-31 DIAGNOSIS — Z9889 Other specified postprocedural states: Secondary | ICD-10-CM | POA: Diagnosis not present

## 2023-08-31 DIAGNOSIS — E119 Type 2 diabetes mellitus without complications: Secondary | ICD-10-CM | POA: Diagnosis not present

## 2023-08-31 DIAGNOSIS — N898 Other specified noninflammatory disorders of vagina: Secondary | ICD-10-CM | POA: Diagnosis not present

## 2023-08-31 DIAGNOSIS — Z01419 Encounter for gynecological examination (general) (routine) without abnormal findings: Secondary | ICD-10-CM | POA: Diagnosis not present

## 2023-09-07 DIAGNOSIS — E118 Type 2 diabetes mellitus with unspecified complications: Secondary | ICD-10-CM | POA: Diagnosis not present

## 2023-09-07 DIAGNOSIS — N182 Chronic kidney disease, stage 2 (mild): Secondary | ICD-10-CM | POA: Diagnosis not present

## 2023-09-07 DIAGNOSIS — E66811 Obesity, class 1: Secondary | ICD-10-CM | POA: Diagnosis not present

## 2023-09-07 DIAGNOSIS — I10 Essential (primary) hypertension: Secondary | ICD-10-CM | POA: Diagnosis not present

## 2023-09-07 DIAGNOSIS — E8889 Other specified metabolic disorders: Secondary | ICD-10-CM | POA: Diagnosis not present

## 2023-09-21 DIAGNOSIS — E118 Type 2 diabetes mellitus with unspecified complications: Secondary | ICD-10-CM | POA: Diagnosis not present

## 2023-09-21 DIAGNOSIS — E611 Iron deficiency: Secondary | ICD-10-CM | POA: Diagnosis not present

## 2023-09-21 DIAGNOSIS — I10 Essential (primary) hypertension: Secondary | ICD-10-CM | POA: Diagnosis not present

## 2023-09-21 DIAGNOSIS — N182 Chronic kidney disease, stage 2 (mild): Secondary | ICD-10-CM | POA: Diagnosis not present

## 2023-10-19 DIAGNOSIS — I10 Essential (primary) hypertension: Secondary | ICD-10-CM | POA: Diagnosis not present

## 2023-10-19 DIAGNOSIS — E118 Type 2 diabetes mellitus with unspecified complications: Secondary | ICD-10-CM | POA: Diagnosis not present

## 2023-10-19 DIAGNOSIS — E611 Iron deficiency: Secondary | ICD-10-CM | POA: Diagnosis not present

## 2023-10-19 DIAGNOSIS — N182 Chronic kidney disease, stage 2 (mild): Secondary | ICD-10-CM | POA: Diagnosis not present

## 2023-11-07 DIAGNOSIS — L409 Psoriasis, unspecified: Secondary | ICD-10-CM | POA: Diagnosis not present

## 2023-11-07 DIAGNOSIS — D509 Iron deficiency anemia, unspecified: Secondary | ICD-10-CM | POA: Diagnosis not present

## 2023-11-07 DIAGNOSIS — E119 Type 2 diabetes mellitus without complications: Secondary | ICD-10-CM | POA: Diagnosis not present

## 2023-11-07 DIAGNOSIS — M2550 Pain in unspecified joint: Secondary | ICD-10-CM | POA: Diagnosis not present

## 2023-11-07 DIAGNOSIS — I10 Essential (primary) hypertension: Secondary | ICD-10-CM | POA: Diagnosis not present

## 2023-11-28 DIAGNOSIS — I10 Essential (primary) hypertension: Secondary | ICD-10-CM | POA: Diagnosis not present

## 2023-11-28 DIAGNOSIS — N182 Chronic kidney disease, stage 2 (mild): Secondary | ICD-10-CM | POA: Diagnosis not present

## 2023-11-28 DIAGNOSIS — E611 Iron deficiency: Secondary | ICD-10-CM | POA: Diagnosis not present

## 2023-11-28 DIAGNOSIS — E118 Type 2 diabetes mellitus with unspecified complications: Secondary | ICD-10-CM | POA: Diagnosis not present

## 2023-11-29 DIAGNOSIS — M7711 Lateral epicondylitis, right elbow: Secondary | ICD-10-CM | POA: Diagnosis not present

## 2024-01-01 DIAGNOSIS — L259 Unspecified contact dermatitis, unspecified cause: Secondary | ICD-10-CM | POA: Diagnosis not present

## 2024-01-04 DIAGNOSIS — L209 Atopic dermatitis, unspecified: Secondary | ICD-10-CM | POA: Diagnosis not present

## 2024-01-04 DIAGNOSIS — L409 Psoriasis, unspecified: Secondary | ICD-10-CM | POA: Diagnosis not present

## 2024-01-08 DIAGNOSIS — I10 Essential (primary) hypertension: Secondary | ICD-10-CM | POA: Diagnosis not present

## 2024-01-08 DIAGNOSIS — E611 Iron deficiency: Secondary | ICD-10-CM | POA: Diagnosis not present

## 2024-01-08 DIAGNOSIS — N182 Chronic kidney disease, stage 2 (mild): Secondary | ICD-10-CM | POA: Diagnosis not present

## 2024-01-08 DIAGNOSIS — E118 Type 2 diabetes mellitus with unspecified complications: Secondary | ICD-10-CM | POA: Diagnosis not present

## 2024-01-11 DIAGNOSIS — M7711 Lateral epicondylitis, right elbow: Secondary | ICD-10-CM | POA: Diagnosis not present

## 2024-01-26 DIAGNOSIS — M7711 Lateral epicondylitis, right elbow: Secondary | ICD-10-CM | POA: Diagnosis not present

## 2024-01-30 DIAGNOSIS — N1831 Chronic kidney disease, stage 3a: Secondary | ICD-10-CM | POA: Diagnosis not present

## 2024-01-30 DIAGNOSIS — I129 Hypertensive chronic kidney disease with stage 1 through stage 4 chronic kidney disease, or unspecified chronic kidney disease: Secondary | ICD-10-CM | POA: Diagnosis not present

## 2024-01-30 DIAGNOSIS — E559 Vitamin D deficiency, unspecified: Secondary | ICD-10-CM | POA: Diagnosis not present

## 2024-01-30 DIAGNOSIS — Z862 Personal history of diseases of the blood and blood-forming organs and certain disorders involving the immune mechanism: Secondary | ICD-10-CM | POA: Diagnosis not present

## 2024-02-02 DIAGNOSIS — M7711 Lateral epicondylitis, right elbow: Secondary | ICD-10-CM | POA: Diagnosis not present

## 2024-02-09 DIAGNOSIS — M7711 Lateral epicondylitis, right elbow: Secondary | ICD-10-CM | POA: Diagnosis not present

## 2024-02-13 DIAGNOSIS — I10 Essential (primary) hypertension: Secondary | ICD-10-CM | POA: Diagnosis not present

## 2024-02-13 DIAGNOSIS — D509 Iron deficiency anemia, unspecified: Secondary | ICD-10-CM | POA: Diagnosis not present

## 2024-02-13 DIAGNOSIS — E119 Type 2 diabetes mellitus without complications: Secondary | ICD-10-CM | POA: Diagnosis not present

## 2024-02-13 DIAGNOSIS — L309 Dermatitis, unspecified: Secondary | ICD-10-CM | POA: Diagnosis not present

## 2024-02-13 DIAGNOSIS — L409 Psoriasis, unspecified: Secondary | ICD-10-CM | POA: Diagnosis not present

## 2024-02-13 DIAGNOSIS — Z23 Encounter for immunization: Secondary | ICD-10-CM | POA: Diagnosis not present

## 2024-02-16 DIAGNOSIS — M7711 Lateral epicondylitis, right elbow: Secondary | ICD-10-CM | POA: Diagnosis not present

## 2024-03-07 DIAGNOSIS — E118 Type 2 diabetes mellitus with unspecified complications: Secondary | ICD-10-CM | POA: Diagnosis not present

## 2024-03-07 DIAGNOSIS — I10 Essential (primary) hypertension: Secondary | ICD-10-CM | POA: Diagnosis not present

## 2024-03-07 DIAGNOSIS — E611 Iron deficiency: Secondary | ICD-10-CM | POA: Diagnosis not present

## 2024-03-07 DIAGNOSIS — N182 Chronic kidney disease, stage 2 (mild): Secondary | ICD-10-CM | POA: Diagnosis not present

## 2024-03-08 DIAGNOSIS — M7711 Lateral epicondylitis, right elbow: Secondary | ICD-10-CM | POA: Diagnosis not present

## 2024-03-18 DIAGNOSIS — L209 Atopic dermatitis, unspecified: Secondary | ICD-10-CM | POA: Diagnosis not present

## 2024-03-18 DIAGNOSIS — L409 Psoriasis, unspecified: Secondary | ICD-10-CM | POA: Diagnosis not present

## 2024-03-18 DIAGNOSIS — R21 Rash and other nonspecific skin eruption: Secondary | ICD-10-CM | POA: Diagnosis not present

## 2024-03-21 DIAGNOSIS — M7711 Lateral epicondylitis, right elbow: Secondary | ICD-10-CM | POA: Diagnosis not present

## 2024-04-29 DIAGNOSIS — L409 Psoriasis, unspecified: Secondary | ICD-10-CM | POA: Diagnosis not present

## 2024-04-29 DIAGNOSIS — R21 Rash and other nonspecific skin eruption: Secondary | ICD-10-CM | POA: Diagnosis not present
